# Patient Record
Sex: Male | Born: 1985 | Race: Black or African American | Hispanic: No | Marital: Single | Smoking: Current every day smoker
Health system: Southern US, Community
[De-identification: ages and names within clinical notes are randomized; demographics above are authoritative.]

## PROBLEM LIST (undated history)

## (undated) DIAGNOSIS — Z789 Other specified health status: Secondary | ICD-10-CM

## (undated) HISTORY — PX: ANTERIOR CRUCIATE LIGAMENT REPAIR: SHX115

## (undated) HISTORY — PX: NO PAST SURGERIES: SHX2092

---
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
## 2007-07-06 ENCOUNTER — Emergency Department (HOSPITAL_COMMUNITY): Admission: EM | Admit: 2007-07-06 | Discharge: 2007-07-06 | Payer: Self-pay | Admitting: Emergency Medicine

## 2011-11-18 ENCOUNTER — Ambulatory Visit (INDEPENDENT_AMBULATORY_CARE_PROVIDER_SITE_OTHER): Payer: 59 | Admitting: Family Medicine

## 2011-11-18 ENCOUNTER — Ambulatory Visit: Payer: 59

## 2011-11-18 VITALS — BP 140/81 | HR 88 | Temp 98.1°F | Resp 16 | Ht 69.78 in | Wt 228.2 lb

## 2011-11-18 DIAGNOSIS — M25569 Pain in unspecified knee: Secondary | ICD-10-CM

## 2011-11-18 DIAGNOSIS — M25562 Pain in left knee: Secondary | ICD-10-CM

## 2011-11-18 MED ORDER — MELOXICAM 15 MG PO TABS: 15.0000 mg | ORAL_TABLET | Freq: Every day | ORAL | Status: AC

## 2011-11-18 MED ORDER — HYDROCODONE-ACETAMINOPHEN 5-500 MG PO TABS: 1.0000 | ORAL_TABLET | Freq: Three times a day (TID) | ORAL | Status: AC | PRN

## 2011-11-18 NOTE — Progress Notes (Signed)
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  He could not continue to play because of pain.  This happened about 4 hours prior to exam  O:  Mild effusion, with swelling behind inferior patellar tendon Pain with ACL stress, no obvious laxity Tender MCL and LCL  [facility] reading (PRIMARY) by  Dr. Milus Glazier:  .no acute findings  A:  Internal derangement of knee  P:  Meloxicam, vicodin at hs Knee immobilizer Recheck 72 hours.

## 2011-11-18 NOTE — Patient Instructions (Addendum)
Recheck on Saturday    Knee Effusion The medical term for having fluid in your knee is effusion. This is often due to an internal derangement of the knee. This means something is wrong inside the knee. Some of the causes of fluid in the knee may be torn cartilage, a torn ligament, or bleeding into the joint from an injury. Your knee is likely more difficult to bend and move. This is often because there is increased pain and pressure in the joint. The time it takes for recovery from a knee effusion depends on different factors, including:   Type of injury.   Your age.   Physical and medical conditions.   Rehabilitation Strategies.  How long you will be away from your normal activities will depend on what kind of knee problem you have and how much damage is present. Your knee has two types of cartilage. Articular cartilage covers the bone ends and lets your knee bend and move smoothly. Two menisci, thick pads of cartilage that form a rim inside the joint, help absorb shock and stabilize your knee. Ligaments bind the bones together and support your knee joint. Muscles move the joint, help support your knee, and take stress off the joint itself. CAUSES  Often an effusion in the knee is caused by an injury to one of the menisci. This is often a tear in the cartilage. Recovery after a meniscus injury depends on how much meniscus is damaged and whether you have damaged other knee tissue. Small tears may heal on their own with conservative treatment. Conservative means rest, limited weight bearing activity and muscle strengthening exercises. Your recovery may take up to 6 weeks.  TREATMENT  Larger tears may require surgery. Meniscus injuries may be treated during arthroscopy. Arthroscopy is a procedure in which your surgeon uses a small telescope like instrument to look in your knee. Your caregiver can make a more accurate diagnosis (learning what is wrong) by performing an arthroscopic procedure. If your  injury is on the inner margin of the meniscus, your surgeon may trim the meniscus back to a smooth rim. In other cases your surgeon will try to repair a damaged meniscus with stitches (sutures). This may make rehabilitation take longer, but may provide better long term result by helping your knee keep its shock absorption capabilities. Ligaments which are completely torn usually require surgery for repair. HOME CARE INSTRUCTIONS  Use crutches as instructed.   If a brace is applied, use as directed.   Once you are home, an ice pack applied to your swollen knee may help with discomfort and help decrease swelling.   Keep your knee raised (elevated) when you are not up and around or on crutches.   Only take over-the-counter or prescription medicines for pain, discomfort, or fever as directed by your caregiver.   Your caregivers will help with instructions for rehabilitation of your knee. This often includes strengthening exercises.   You may resume a normal diet and activities as directed.  SEEK MEDICAL CARE IF:   There is increased swelling in your knee.   You notice redness, swelling, or increasing pain in your knee.   An unexplained oral temperature above 102 F (38.9 C) develops.  SEEK IMMEDIATE MEDICAL CARE IF:   You develop a rash.   You have difficulty breathing.   You have any allergic reactions from medications you may have been given.   There is severe pain with any motion of the knee.  MAKE SURE YOU:  Understand these instructions.   Will watch your condition.   Will get help right away if you are not doing well or get worse.  Document Released: 10/17/2003 Document Revised: 07/16/2011 Document Reviewed: 12/21/2007 [facility] Patient Information 2012 [place], [place].

## 2011-11-21 ENCOUNTER — Ambulatory Visit (INDEPENDENT_AMBULATORY_CARE_PROVIDER_SITE_OTHER): Payer: 59 | Admitting: Family Medicine

## 2011-11-21 VITALS — BP 141/83 | HR 71 | Temp 98.0°F | Resp 16 | Ht 69.0 in | Wt 234.0 lb

## 2011-11-21 DIAGNOSIS — M25562 Pain in left knee: Secondary | ICD-10-CM

## 2011-11-21 DIAGNOSIS — M25569 Pain in unspecified knee: Secondary | ICD-10-CM

## 2011-11-21 NOTE — Progress Notes (Signed)
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 He could not continue to play because of pain. This happened about 48 hours ago. Since that time, patient has noted decreasing pain and to some extent decreasing swelling. He continues to feel like his knee might give way however.  Objective moderate effusion, I felt a foreign body type nodule floating around the lateral aspect of the left knee and the joint space. Ligaments appear to be intact testing. He is nontender. There is no evidence of infection. He's able to flex his knee about 70.  Assessment: Internal derangement left knee. Mild improvement but I am very concerned he may have a torn cartilage or worse  Plan: MRI ASAP continue the knee immobilizer

## 2011-11-22 ENCOUNTER — Ambulatory Visit (HOSPITAL_COMMUNITY)
Admission: RE | Admit: 2011-11-22 | Discharge: 2011-11-22 | Disposition: A | Discharge status: Home / Self Care | Payer: 59 | Source: Ambulatory Visit | Attending: Family Medicine | Admitting: Family Medicine

## 2011-11-22 DIAGNOSIS — M25562 Pain in left knee: Secondary | ICD-10-CM

## 2011-11-22 DIAGNOSIS — IMO0002 Reserved for concepts with insufficient information to code with codable children: Secondary | ICD-10-CM | POA: Insufficient documentation

## 2011-11-22 DIAGNOSIS — Y9367 Activity, basketball: Secondary | ICD-10-CM | POA: Insufficient documentation

## 2011-11-22 DIAGNOSIS — S83509A Sprain of unspecified cruciate ligament of unspecified knee, initial encounter: Secondary | ICD-10-CM | POA: Insufficient documentation

## 2011-11-22 DIAGNOSIS — X500XXA Overexertion from strenuous movement or load, initial encounter: Secondary | ICD-10-CM | POA: Insufficient documentation

## 2011-11-23 ENCOUNTER — Telehealth: Payer: Self-pay | Admitting: Family Medicine

## 2011-11-23 ENCOUNTER — Other Ambulatory Visit: Payer: Self-pay | Admitting: Family Medicine

## 2011-11-23 DIAGNOSIS — S83519A Sprain of anterior cruciate ligament of unspecified knee, initial encounter: Secondary | ICD-10-CM

## 2011-11-23 NOTE — Telephone Encounter (Signed)
Referral made 

## 2011-11-27 ENCOUNTER — Encounter (HOSPITAL_BASED_OUTPATIENT_CLINIC_OR_DEPARTMENT_OTHER): Payer: Self-pay | Admitting: *Deleted

## 2011-11-27 ENCOUNTER — Other Ambulatory Visit: Payer: Self-pay | Admitting: Pain Medicine

## 2011-11-27 NOTE — Progress Notes (Signed)
Pt instructed npo p mn 2/22.  To [facility] 4/23 @ 1130.  Needs hgb on arrival.

## 2011-12-01 ENCOUNTER — Ambulatory Visit (HOSPITAL_BASED_OUTPATIENT_CLINIC_OR_DEPARTMENT_OTHER): Payer: 59

## 2011-12-01 ENCOUNTER — Ambulatory Visit (HOSPITAL_BASED_OUTPATIENT_CLINIC_OR_DEPARTMENT_OTHER)
Admission: RE | Admit: 2011-12-01 | Discharge: 2011-12-01 | Disposition: A | Discharge status: Home / Self Care | Payer: 59 | Source: Ambulatory Visit | Attending: Specialist | Admitting: Specialist

## 2011-12-01 ENCOUNTER — Encounter (HOSPITAL_BASED_OUTPATIENT_CLINIC_OR_DEPARTMENT_OTHER): Payer: Self-pay | Admitting: *Deleted

## 2011-12-01 ENCOUNTER — Encounter (HOSPITAL_BASED_OUTPATIENT_CLINIC_OR_DEPARTMENT_OTHER): Payer: Self-pay

## 2011-12-01 ENCOUNTER — Encounter (HOSPITAL_BASED_OUTPATIENT_CLINIC_OR_DEPARTMENT_OTHER)
Admission: RE | Disposition: A | Discharge status: Home / Self Care | Payer: Self-pay | Source: Ambulatory Visit | Attending: Specialist

## 2011-12-01 ENCOUNTER — Encounter (HOSPITAL_BASED_OUTPATIENT_CLINIC_OR_DEPARTMENT_OTHER): Payer: Self-pay | Admitting: Anesthesiology

## 2011-12-01 DIAGNOSIS — S83509A Sprain of unspecified cruciate ligament of unspecified knee, initial encounter: Secondary | ICD-10-CM | POA: Insufficient documentation

## 2011-12-01 DIAGNOSIS — Z9889 Other specified postprocedural states: Secondary | ICD-10-CM

## 2011-12-01 DIAGNOSIS — IMO0002 Reserved for concepts with insufficient information to code with codable children: Secondary | ICD-10-CM | POA: Insufficient documentation

## 2011-12-01 DIAGNOSIS — X58XXXA Exposure to other specified factors, initial encounter: Secondary | ICD-10-CM | POA: Insufficient documentation

## 2011-12-01 DIAGNOSIS — F17200 Nicotine dependence, unspecified, uncomplicated: Secondary | ICD-10-CM | POA: Insufficient documentation

## 2011-12-01 HISTORY — DX: Other specified health status: Z78.9

## 2011-12-01 LAB — POCT HEMOGLOBIN-HEMACUE: Hemoglobin: 17.5 g/dL — ABNORMAL HIGH (ref 13.0–17.0)

## 2011-12-01 SURGERY — KNEE ARTHROSCOPY WITH ANTERIOR CRUCIATE LIGAMENT (ACL) REPAIR
Anesthesia: General | Site: Knee | Laterality: Left | Wound class: Clean

## 2011-12-01 MED ORDER — MIDAZOLAM HCL 2 MG/2ML IJ SOLN
2.0000 mg | INTRAMUSCULAR | Status: DC | PRN
  Administered 2011-12-01 (×2): 2 mg via INTRAVENOUS
  Administered 2011-12-01: 1 mg via INTRAVENOUS

## 2011-12-01 MED ORDER — CEPHALEXIN 500 MG PO CAPS: 500.0000 mg | ORAL_CAPSULE | Freq: Three times a day (TID) | ORAL | Status: AC

## 2011-12-01 MED ORDER — LACTATED RINGERS IV SOLN: INTRAVENOUS | Status: DC

## 2011-12-01 MED ORDER — PROPOFOL 10 MG/ML IV EMUL
INTRAVENOUS | Status: DC | PRN
  Administered 2011-12-01: 300 mg via INTRAVENOUS

## 2011-12-01 MED ORDER — SODIUM CHLORIDE 0.9 % IJ SOLN
Freq: Once | INTRAMUSCULAR | Status: DC
  Filled 2011-12-01: qty 15

## 2011-12-01 MED ORDER — FENTANYL CITRATE 0.05 MG/ML IJ SOLN: 25.0000 ug | INTRAMUSCULAR | Status: DC | PRN

## 2011-12-01 MED ORDER — FENTANYL CITRATE 0.05 MG/ML IJ SOLN
INTRAMUSCULAR | Status: DC | PRN
  Administered 2011-12-01: 50 ug via INTRAVENOUS
  Administered 2011-12-01: 100 ug via INTRAVENOUS
  Administered 2011-12-01: 50 ug via INTRAVENOUS

## 2011-12-01 MED ORDER — DEXAMETHASONE SODIUM PHOSPHATE 4 MG/ML IJ SOLN
INTRAMUSCULAR | Status: DC | PRN
  Administered 2011-12-01: 10 mg via INTRAVENOUS

## 2011-12-01 MED ORDER — BUPIVACAINE HCL (PF) 0.25 % IJ SOLN: INTRAMUSCULAR | Status: DC | PRN

## 2011-12-01 MED ORDER — ONDANSETRON HCL 4 MG/2ML IJ SOLN
INTRAMUSCULAR | Status: DC | PRN
  Administered 2011-12-01: 4 mg via INTRAVENOUS

## 2011-12-01 MED ORDER — OXYCODONE-ACETAMINOPHEN 5-325 MG PO TABS: 2.0000 | ORAL_TABLET | ORAL | Status: DC | PRN

## 2011-12-01 MED ORDER — METHOCARBAMOL 500 MG PO TABS: 500.0000 mg | ORAL_TABLET | Freq: Four times a day (QID) | ORAL | Status: AC | PRN

## 2011-12-01 MED ORDER — POVIDONE-IODINE 7.5 % EX SOLN: Freq: Once | CUTANEOUS | Status: DC

## 2011-12-01 MED ORDER — LIDOCAINE HCL (CARDIAC) 20 MG/ML IV SOLN
INTRAVENOUS | Status: DC | PRN
  Administered 2011-12-01: 80 mg via INTRAVENOUS

## 2011-12-01 MED ORDER — LACTATED RINGERS IV SOLN
INTRAVENOUS | Status: DC
  Administered 2011-12-01 (×2): via INTRAVENOUS

## 2011-12-01 MED ORDER — MORPHINE SULFATE 4 MG/ML IJ SOLN
INTRAMUSCULAR | Status: DC | PRN
  Administered 2011-12-01: 16:00:00 via INTRA_ARTICULAR

## 2011-12-01 MED ORDER — OXYCODONE-ACETAMINOPHEN 5-325 MG PO TABS: 1.0000 | ORAL_TABLET | ORAL | Status: AC | PRN

## 2011-12-01 MED ORDER — OXYCODONE-ACETAMINOPHEN 5-325 MG PO TABS
1.0000 | ORAL_TABLET | ORAL | Status: AC | PRN
  Administered 2011-12-01: 1 via ORAL

## 2011-12-01 MED ORDER — SODIUM CHLORIDE 0.9 % IV SOLN: INTRAVENOUS | Status: DC

## 2011-12-01 MED ORDER — CEFAZOLIN SODIUM 1-5 GM-% IV SOLN
1.0000 g | INTRAVENOUS | Status: AC
  Administered 2011-12-01: 2 g via INTRAVENOUS

## 2011-12-01 MED ORDER — ROPIVACAINE HCL 5 MG/ML IJ SOLN
INTRAMUSCULAR | Status: DC | PRN
  Administered 2011-12-01: 30 mL

## 2011-12-01 MED ORDER — METHOCARBAMOL 500 MG PO TABS
500.0000 mg | ORAL_TABLET | Freq: Three times a day (TID) | ORAL | Status: AC
  Administered 2011-12-01: 500 mg via ORAL

## 2011-12-01 MED ORDER — FENTANYL CITRATE 0.05 MG/ML IJ SOLN
50.0000 ug | INTRAMUSCULAR | Status: DC | PRN
  Administered 2011-12-01 (×2): 50 ug via INTRAVENOUS

## 2011-12-01 MED ORDER — SODIUM CHLORIDE 0.9 % IR SOLN
Status: DC | PRN
  Administered 2011-12-01: 6

## 2011-12-01 SURGICAL SUPPLY — 73 items
ANCHOR BUTTON TIGHTROPE ACL RT (Orthopedic Implant) ×2 IMPLANT
ANCHOR PUSHLOCK BIOCOMP 3.5X19 (Orthopedic Implant) ×2 IMPLANT
BANDAGE ESMARK 6X9 LF (GAUZE/BANDAGES/DRESSINGS) ×1 IMPLANT
BANDAGE GAUZE ELAST BULKY 4 IN (GAUZE/BANDAGES/DRESSINGS) ×2 IMPLANT
BLADE 4.2CUDA (BLADE) IMPLANT
BLADE CUDA 5.5 (BLADE) ×2 IMPLANT
BLADE CUDA GRT WHITE 3.5 (BLADE) ×2 IMPLANT
BLADE SURG 10 STRL SS (BLADE) ×2 IMPLANT
BLADE SURG 15 STRL LF DISP TIS (BLADE) ×1 IMPLANT
BLADE SURG 15 STRL SS (BLADE) ×1
BNDG ESMARK 6X9 LF (GAUZE/BANDAGES/DRESSINGS) ×2
BUR OVAL 6.0 (BURR) IMPLANT
BUR VERTEX HOODED 4.5 (BURR) ×2 IMPLANT
CANISTER SUCT LVC 12 LTR MEDI- (MISCELLANEOUS) ×4 IMPLANT
CANISTER SUCTION 1200CC (MISCELLANEOUS) IMPLANT
CANISTER SUCTION 2500CC (MISCELLANEOUS) IMPLANT
CLOTH BEACON ORANGE TIMEOUT ST (SAFETY) ×2 IMPLANT
COVER TABLE BACK 60X90 (DRAPES) ×2 IMPLANT
DRAPE ARTHROSCOPY W/POUCH 114 (DRAPES) ×2 IMPLANT
DRAPE INCISE IOBAN 66X45 STRL (DRAPES) ×2 IMPLANT
DRAPE LG THREE QUARTER DISP (DRAPES) ×4 IMPLANT
DRAPE OEC MINIVIEW 54X84 (DRAPES) ×2 IMPLANT
DRILL PIN TRIM IT AR-4515 ×2 IMPLANT
DRSG PAD ABDOMINAL 8X10 ST (GAUZE/BANDAGES/DRESSINGS) ×4 IMPLANT
DURAPREP 26ML APPLICATOR (WOUND CARE) ×2 IMPLANT
ELECT REM PT RETURN 9FT ADLT (ELECTROSURGICAL) ×2
ELECTRODE REM PT RTRN 9FT ADLT (ELECTROSURGICAL) ×1 IMPLANT
FIBERSTICK 2 (SUTURE) ×2 IMPLANT
FLIP CUTTER ×2 IMPLANT
GAUZE XEROFORM 1X8 LF (GAUZE/BANDAGES/DRESSINGS) ×2 IMPLANT
GLOVE BIO SURGEON STRL SZ 6.5 (GLOVE) ×2 IMPLANT
GLOVE BIO SURGEON STRL SZ8 (GLOVE) ×6 IMPLANT
GLOVE ECLIPSE 6.0 STRL STRAW (GLOVE) ×2 IMPLANT
GLOVE INDICATOR 8.0 STRL GRN (GLOVE) ×4 IMPLANT
GLOVE SURG ORTHO 8.0 STRL STRW (GLOVE) ×2 IMPLANT
GOWN PREVENTION PLUS LG XLONG (DISPOSABLE) ×2 IMPLANT
GOWN STRL REIN XL XLG (GOWN DISPOSABLE) ×4 IMPLANT
IMMOBILIZER KNEE 24 THIGH 36 (MISCELLANEOUS) ×1 IMPLANT
IMMOBILIZER KNEE 24 UNIV (MISCELLANEOUS) ×2
IV NS IRRIG 3000ML ARTHROMATIC (IV SOLUTION) ×12 IMPLANT
KIT TRANSTIBIAL (DISPOSABLE) ×2 IMPLANT
KNEE WRAP E Z 3 GEL PACK (MISCELLANEOUS) ×2 IMPLANT
KNOT PUSHER/SUTURE CUTTER ×2 IMPLANT
MINI VAC (SURGICAL WAND) IMPLANT
Meniscal Cinch - Arthrex ×2 IMPLANT
NEEDLE HYPO 22GX1.5 SAFETY (NEEDLE) IMPLANT
PACK ARTHROSCOPY DSU (CUSTOM PROCEDURE TRAY) ×2 IMPLANT
PACK BASIN DAY SURGERY FS (CUSTOM PROCEDURE TRAY) ×2 IMPLANT
PADDING CAST ABS 4INX4YD NS (CAST SUPPLIES) ×1
PADDING CAST ABS COTTON 4X4 ST (CAST SUPPLIES) ×1 IMPLANT
PENCIL BUTTON HOLSTER BLD 10FT (ELECTRODE) ×2 IMPLANT
SCREW BIOCOMPOSITE 10X35 (Screw) ×2 IMPLANT
SET ARTHROSCOPY TUBING (MISCELLANEOUS) ×1
SET ARTHROSCOPY TUBING LN (MISCELLANEOUS) ×1 IMPLANT
SET PAD KNEE POSITIONER (MISCELLANEOUS) ×2 IMPLANT
SPONGE GAUZE 4X4 12PLY (GAUZE/BANDAGES/DRESSINGS) ×2 IMPLANT
SPONGE LAP 4X18 X RAY DECT (DISPOSABLE) ×2 IMPLANT
STRIP CLOSURE SKIN 1/2X4 (GAUZE/BANDAGES/DRESSINGS) ×2 IMPLANT
SUCTION FRAZIER TIP 10 FR DISP (SUCTIONS) ×2 IMPLANT
SUT 2 FIBERLOOP 20 STRT BLUE (SUTURE) ×4
SUT ETHILON 4 0 PS 2 18 (SUTURE) ×2 IMPLANT
SUT MNCRL AB 3-0 PS2 18 (SUTURE) ×2 IMPLANT
SUT VIC AB 0 CT1 36 (SUTURE) ×4 IMPLANT
SUT VIC AB 0 CT2 27 (SUTURE) ×10 IMPLANT
SUT VIC AB 2-0 CT1 27 (SUTURE) ×1
SUT VIC AB 2-0 CT1 TAPERPNT 27 (SUTURE) ×1 IMPLANT
SUTURE 2 FIBERLOOP 20 STRT BLU (SUTURE) ×2 IMPLANT
SYR CONTROL 10ML LL (SYRINGE) ×2 IMPLANT
TOWEL OR 17X24 6PK STRL BLUE (TOWEL DISPOSABLE) ×2 IMPLANT
TRANSTIBIAL RETRO DRILL PIN ×2 IMPLANT
WAND 30 DEG SABER W/CORD (SURGICAL WAND) IMPLANT
WAND 90 DEG TURBOVAC W/CORD (SURGICAL WAND) IMPLANT
WATER STERILE IRR 500ML POUR (IV SOLUTION) ×2 IMPLANT

## 2011-12-01 NOTE — Discharge Instructions (Signed)

## 2011-12-01 NOTE — Op Note (Signed)
Preop diagnosis left knee torn anterior cruciate ligament and possible torn medial meniscus Postoperative diagnoses #1 left knee complete rupture anterior cruciate ligament. #2 medial meniscus tear Procedure #1 left knee arthroscopic assisted hamstring anterior cruciate ligament reconstruction #2 medial meniscus repair Surgeon Valma Cava M.D. Asst. Kingsley Spittle Anesthesia Gen. with femoral nerve and knee block Estimated blood loss minimal Tourniquet time one hour and 40 minutes at 250 mm of mercury Drains none Complications none Disposition PACU stable  Operative details Patient and family was counseled in the holding area cracks side was marked. IV started sedation given and nerve blocks administered per the anesthesiologist. Chart signed. Taken operating room. IV Ancef Dennis Hayden to the operating room. TED hoses applied to the uninvolved leg.  In the operating room posterior general anesthesia options and were well padded. Left knee examined full range of motion markedly positive Lachman anterior drawer and pivot shift collateral ligaments and posterior lateral corner and PCL were stable. Elevated prepped with DuraPrep and draped into a sterile fashion exsanguinated with an Esmarch and tourniquet inflated to 250 mm mercury. Attention was first directed to harvesting the hamstring tendons through an incision made over the pes sartorius fascia was identified and opened gracilis and semitendinosus were identified and harvested in standard fashion protecting neurovascular structures. These were taken to the back table and prepared Mr. Kingsley Spittle. Sartorius fascia closed with Vicryl suture. Arthroscopic portion undertaken proximal medial inferomedial inferolateral portals were established. Diagnostic arthroscopy revealed a complete rupture of anterior cruciate ligament Dennis L. integrative maria shaver notchplasty was performed in standard technique confirming the over-the-top position. Medial  side inspected articular cartilage healthy meniscus red zone tear posteriorly unstable. Reduced anatomically and securely fixed with Arthrex meniscal sandwich device. Telephone joint unremarkable lateral side noted in: For normal lateral meniscus Knee was flexed the Arthrex guide was placed to the knee put into the over the top position small stab wound laterally and Arthrex retro-socket was performed FiberWire suture was passed tunnel was debrided and rasped . Tibial reamer was set into the anterior cruciate ligament footprint on the tibia and a retro-tunnel was performed tunnel was dilated properly suture was pulled distally and debris was removed from the knee. The graft was now delivered to the knee joint in a retrograde fashion the Arthrex anterior cruciate ligament tight grip system was utilized and the button was hooked up to the over-the-top position on the femoral cortex confirmed visually arthroscopically and radiographically with C-arm. Graft was delivered into the knee joint. Knee put through range of motion no notch impingement. The graft and then wrapped to create during the preparation. At 30 of proximal and neutral rotation on distally on the tibial graft it was securely fixed the tibial tunnel with an Arthrex bio composite screw. Graft had excellent orientation tension in the supplement distally with a push lock anchor. At that the knee the range of motion graft that had excellent orientation and tension the graft was stable throughout and radiographically the button was in excellent position the lateral femoral cortex. Was irrigated copiously. A scope equipment was removed. The lateral side the subcutaneous closed with Vicryl skin was nylon portal nylon anteriorly subcutaneous followed by subcuticular Monocryl suture. Another total of 20 cc of 0.25% Marcaine was placed into the skin edges and the joint with 4 mg of morphine sulfate into the knee joint. Sterile dressing was applied TED hose  tourniquet deflated ice pack and brace in full extension. At application of dressings tourniquet deflated normal pulses in the  foot and ankle. Awakened from general anesthesia and taken to the operating room to the PACU in stable condition.  To help with patient is a sling prepping draping technical and surgical assistance throughout the entire case graft preparation at the wound closure application of dressing and splint Mr. Kingsley Spittle PA-C assistance was needed.

## 2011-12-01 NOTE — H&P (Signed)
Dennis Hayden is an 26 y.o. male.   Chief Complaint: Left Knee pain and instablity HPI:Pt 37 YOA male with H/O injury to left knee. Eval with MRI found ACL tear. With pain and unstable feeling will have ACL reconstruction with autograft hamstring.  Past Medical History  Diagnosis Date  . No pertinent past medical history     Past Surgical History  Procedure Date  . No past surgeries     History reviewed. No pertinent family history. Social History:  reports that he has been smoking Cigars.  He does not have any smokeless tobacco history on file. He reports that he drinks alcohol. His drug history not on file.  Allergies: No Known Allergies  Medications Prior to Admission  Medication Sig Dispense Refill  . HYDROcodone-acetaminophen (VICODIN) 5-500 MG per tablet Take 1 tablet by mouth every 8 (eight) hours as needed.      . meloxicam (MOBIC) 15 MG tablet Take 1 tablet (15 mg total) by mouth daily.  30 tablet  0    Results for orders placed during the hospital encounter of 12/01/11 (from the past 48 hour(s))  POCT HEMOGLOBIN-HEMACUE     Status: Abnormal   Collection Time   12/01/11 12:21 PM      Component Value Range Comment   Hemoglobin 17.5 (*) 13.0 - 17.0 (g/dL)    No results found.  Review of Systems  Constitutional: Negative.   HENT: Negative.   Respiratory: Negative.   Cardiovascular: Negative.   Gastrointestinal: Negative.   Genitourinary: Negative.   Musculoskeletal: Positive for joint pain.  Skin: Negative.   Neurological: Negative.     Blood pressure 126/78, pulse 58, temperature 98.2 F (36.8 C), temperature source Oral, resp. rate 9, height 5\' 9"  (1.753 m), weight 104.327 kg (230 lb), SpO2 99.00%. Physical Exam Cons alert approp, no distress lungs clear, heart reg, abd soft +BS. Neck supple good rom. Extremities with out deformities, NMVI  Assessment/Plan Left Knee ACL tear  Plan Left Knee scope with eval possible Deb if needed, with ACL autograft hamstring  reconstruction. Jamelle Rushing 12/01/2011, 1:52 PM

## 2011-12-01 NOTE — Transfer of Care (Signed)
Immediate Anesthesia Transfer of Care Note  Patient: Dennis Hayden  Procedure(s) Performed: Procedure(s) (LRB): KNEE ARTHROSCOPY WITH ANTERIOR CRUCIATE LIGAMENT (ACL) REPAIR (Left)  Patient Location: PACU  Anesthesia Type: General  Level of Consciousness: awake, alert  and oriented  Airway & Oxygen Therapy: Patient Spontanous Breathing and Patient connected to nasal cannula oxygen  Post-op Assessment: Report given to PACU RN  Post vital signs: Reviewed and stable  Complications: No apparent anesthesia complications

## 2011-12-01 NOTE — Anesthesia Procedure Notes (Addendum)
Anesthesia Regional Block:  Femoral nerve block  Pre-Anesthetic Checklist: ,, timeout performed, Correct Patient, Correct Site, Correct Laterality, Correct Procedure, Correct Position, site marked, Risks and benefits discussed,  Surgical consent,  Pre-op evaluation,  At surgeon's request and post-op pain management  Laterality: Left  Prep: chloraprep       Needles:  Injection technique: Single-shot  Needle Type: Stimiplex          Additional Needles:  Procedures: ultrasound guided and nerve stimulator Femoral nerve block Narrative:  Start time: 12/01/2011 1:00 PM End time: 12/01/2011 1:13 PM Injection made incrementally with aspirations every 5 mL.  Performed by: Personally  Anesthesiologist: Gaetano Hawthorne MD  Additional Notes: Patient tolerated the procedure well without complications  Femoral nerve block Procedure Name: LMA Insertion Date/Time: 12/01/2011 2:21 PM Performed by: Maris Berger T Pre-anesthesia Checklist: Patient identified, Emergency Drugs available, Suction available and Patient being monitored Patient Re-evaluated:Patient Re-evaluated prior to inductionOxygen Delivery Method: Circle System Utilized Preoxygenation: Pre-oxygenation with 100% oxygen Intubation Type: IV induction Ventilation: Mask ventilation without difficulty LMA: LMA inserted LMA Size: 5.0 Number of attempts: 1 Airway Equipment and Method: bite block Placement Confirmation: positive ETCO2 Dental Injury: Teeth and Oropharynx as per pre-operative assessment
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
## 2011-12-01 NOTE — Anesthesia Preprocedure Evaluation (Signed)

## 2011-12-02 NOTE — Anesthesia Postprocedure Evaluation (Signed)
  Anesthesia Post-op Note  Patient: Dennis Hayden  Procedure(s) Performed: Procedure(s) (LRB): KNEE ARTHROSCOPY WITH ANTERIOR CRUCIATE LIGAMENT (ACL) REPAIR (Left)  Patient Location: PACU  Anesthesia Type: General  Level of Consciousness: awake and alert   Airway and Oxygen Therapy: Patient Spontanous Breathing  Post-op Pain: mild  Post-op Assessment: Post-op Vital signs reviewed, Patient's Cardiovascular Status Stable, Respiratory Function Stable, Patent Airway and No signs of Nausea or vomiting  Post-op Vital Signs: stable  Complications: No apparent anesthesia complications

## 2015-12-27 ENCOUNTER — Emergency Department (HOSPITAL_COMMUNITY)
Admission: EM | Admit: 2015-12-27 | Discharge: 2015-12-28 | Disposition: A | Discharge status: Home / Self Care | Payer: No Typology Code available for payment source | Attending: Emergency Medicine | Admitting: Emergency Medicine

## 2015-12-27 ENCOUNTER — Encounter (HOSPITAL_COMMUNITY): Payer: Self-pay | Admitting: Oncology

## 2015-12-27 DIAGNOSIS — S46911A Strain of unspecified muscle, fascia and tendon at shoulder and upper arm level, right arm, initial encounter: Secondary | ICD-10-CM | POA: Diagnosis not present

## 2015-12-27 DIAGNOSIS — Y92410 Unspecified street and highway as the place of occurrence of the external cause: Secondary | ICD-10-CM | POA: Diagnosis not present

## 2015-12-27 DIAGNOSIS — Y939 Activity, unspecified: Secondary | ICD-10-CM | POA: Diagnosis not present

## 2015-12-27 DIAGNOSIS — F17210 Nicotine dependence, cigarettes, uncomplicated: Secondary | ICD-10-CM | POA: Insufficient documentation

## 2015-12-27 DIAGNOSIS — R51 Headache: Secondary | ICD-10-CM

## 2015-12-27 DIAGNOSIS — R519 Headache, unspecified: Secondary | ICD-10-CM

## 2015-12-27 DIAGNOSIS — S0990XA Unspecified injury of head, initial encounter: Secondary | ICD-10-CM | POA: Diagnosis not present

## 2015-12-27 DIAGNOSIS — S161XXA Strain of muscle, fascia and tendon at neck level, initial encounter: Secondary | ICD-10-CM | POA: Insufficient documentation

## 2015-12-27 DIAGNOSIS — Y999 Unspecified external cause status: Secondary | ICD-10-CM | POA: Diagnosis not present

## 2015-12-27 DIAGNOSIS — T148XXA Other injury of unspecified body region, initial encounter: Secondary | ICD-10-CM
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
## 2015-12-28 MED ORDER — CYCLOBENZAPRINE HCL 10 MG PO TABS: 10.0000 mg | ORAL_TABLET | Freq: Two times a day (BID) | ORAL | Status: DC | PRN

## 2015-12-28 MED ORDER — HYDROCODONE-ACETAMINOPHEN 5-325 MG PO TABS
1.0000 | ORAL_TABLET | Freq: Once | ORAL | Status: AC
  Administered 2015-12-28: 1 via ORAL
  Filled 2015-12-28: qty 1

## 2015-12-28 MED ORDER — IBUPROFEN 800 MG PO TABS: 800.0000 mg | ORAL_TABLET | Freq: Three times a day (TID) | ORAL | Status: DC

## 2015-12-28 MED ORDER — HYDROCODONE-ACETAMINOPHEN 5-325 MG PO TABS: 1.0000 | ORAL_TABLET | ORAL | Status: DC | PRN
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
## 2015-12-28 NOTE — ED Notes (Signed)
PA at bedside.

## 2018-11-07 ENCOUNTER — Emergency Department (HOSPITAL_COMMUNITY): Payer: Self-pay

## 2018-11-07 ENCOUNTER — Emergency Department (HOSPITAL_COMMUNITY)
Admission: EM | Admit: 2018-11-07 | Discharge: 2018-11-07 | Disposition: A | Discharge status: Home / Self Care | Payer: Self-pay | Attending: Emergency Medicine | Admitting: Emergency Medicine

## 2018-11-07 ENCOUNTER — Encounter (HOSPITAL_COMMUNITY): Payer: Self-pay

## 2018-11-07 ENCOUNTER — Other Ambulatory Visit: Payer: Self-pay

## 2018-11-07 DIAGNOSIS — Y929 Unspecified place or not applicable: Secondary | ICD-10-CM | POA: Insufficient documentation

## 2018-11-07 DIAGNOSIS — F17210 Nicotine dependence, cigarettes, uncomplicated: Secondary | ICD-10-CM | POA: Insufficient documentation

## 2018-11-07 DIAGNOSIS — S82101A Unspecified fracture of upper end of right tibia, initial encounter for closed fracture: Secondary | ICD-10-CM | POA: Insufficient documentation

## 2018-11-07 DIAGNOSIS — S82141A Displaced bicondylar fracture of right tibia, initial encounter for closed fracture: Secondary | ICD-10-CM

## 2018-11-07 DIAGNOSIS — Y9389 Activity, other specified: Secondary | ICD-10-CM | POA: Insufficient documentation

## 2018-11-07 DIAGNOSIS — W03XXXA Other fall on same level due to collision with another person, initial encounter: Secondary | ICD-10-CM | POA: Insufficient documentation

## 2018-11-07 DIAGNOSIS — Y999 Unspecified external cause status: Secondary | ICD-10-CM | POA: Insufficient documentation

## 2018-11-07 DIAGNOSIS — S89201A Unspecified physeal fracture of upper end of right fibula, initial encounter for closed fracture: Secondary | ICD-10-CM | POA: Insufficient documentation

## 2018-11-07 MED ORDER — OXYCODONE-ACETAMINOPHEN 5-325 MG PO TABS
2.0000 | ORAL_TABLET | Freq: Once | ORAL | Status: AC
  Administered 2018-11-07: 2 via ORAL
  Filled 2018-11-07: qty 2

## 2018-11-07 MED ORDER — HYDROCODONE-ACETAMINOPHEN 5-325 MG PO TABS: 1.0000 | ORAL_TABLET | Freq: Four times a day (QID) | ORAL | 0 refills | Status: DC | PRN
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
## 2018-11-07 NOTE — ED Provider Notes (Signed)
  Physical Exam  BP (!) 168/98   Pulse 84   Temp 98.9 F (37.2 C) (Oral)   Resp 18   SpO2 100%   Physical Exam  ED Course/Procedures     Procedures  MDM  Received patient in signout.  CT scan done and showed tibial plateau fractures possible ACL injury and small fibular fracture.  Nonweightbearing with knee immobilizer.  Follow-up with Dr. Malvin Johns, MD 11/07/18 [phone]
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
## 2018-11-10 ENCOUNTER — Encounter (INDEPENDENT_AMBULATORY_CARE_PROVIDER_SITE_OTHER): Payer: Self-pay | Admitting: Orthopaedic Surgery

## 2018-11-10 ENCOUNTER — Other Ambulatory Visit: Payer: Self-pay

## 2018-11-10 ENCOUNTER — Ambulatory Visit (INDEPENDENT_AMBULATORY_CARE_PROVIDER_SITE_OTHER): Payer: Self-pay | Admitting: Orthopaedic Surgery

## 2018-11-10 DIAGNOSIS — M25561 Pain in right knee: Secondary | ICD-10-CM

## 2018-11-10 NOTE — Progress Notes (Signed)
Office Visit Note   Patient: Dennis Hayden           Date of Birth: Jul 22, 1986           MRN: [id number] Visit Date: 11/10/2018              Requested by: No referring provider defined for this encounter. PCP: Patient, No Pcp Per   Assessment & Plan: Visit Diagnoses:  1. Right knee pain, unspecified chronicity     Plan: Impression is right knee injury concerning for multi-ligamentous injury.  We will obtain MRI of the right knee urgently to rule out patella tendon rupture or high-grade partial rupture.  We will see him back to review the MRI within a week.  Follow-Up Instructions: Return in about 1 week (around 11/17/2018).   Orders:  Orders Placed This Encounter  Procedures  . MR Knee Right w/o contrast   No orders of the defined types were placed in this encounter.     Procedures: No procedures performed   Clinical Data: No additional findings.   Subjective: Chief Complaint  Patient presents with  . Right Knee - Pain, Injury    Dennis Hayden is a healthy 33 year old gentleman who sustained a right knee injury while running quickly up his stairs at his apartment.  He is not exactly sure what happened with his knee but his knee gave out and he fell.  He was evaluated in the ED recently and x-ray and CT showed multiple avulsion fractures and joint effusion concerning for ligamentous injury.  He has been ambulating with a knee immobilizer and crutches.  He has had a prior ACL reconstruction on his left knee.   Review of Systems  Constitutional: Negative.   All other systems reviewed and are negative.    Objective: Vital Signs: There were no vitals taken for this visit.  Physical Exam Vitals signs and nursing note reviewed.  Constitutional:      Appearance: He is well-developed.  HENT:     Head: Normocephalic and atraumatic.  Eyes:     Pupils: Pupils are equal, round, and reactive to light.  Neck:     Musculoskeletal: Neck supple.  Pulmonary:     Effort: Pulmonary  effort is normal.  Abdominal:     Palpations: Abdomen is soft.  Musculoskeletal: Normal range of motion.  Skin:    General: Skin is warm.  Neurological:     Mental Status: He is alert and oriented to person, place, and time.  Psychiatric:        Behavior: Behavior normal.        Thought Content: Thought content normal.        Judgment: Judgment normal.     Ortho Exam Right knee exam shows a moderate sized joint effusion.  There is laxity with Lockman test and anterior drawer test.  He does have a slight defect with palpation of the inferior pole of the patella which is tender to palpation.  He does have an extensor lag with straight leg raise.  Collaterals appear to be grossly intact. Specialty Comments:  No specialty comments available.  Imaging: No results found.   PMFS History: There are no active problems to display for this patient.  Past Medical History:  Diagnosis Date  . No pertinent past medical history     History reviewed. No pertinent family history.  Past Surgical History:  Procedure Laterality Date  . NO PAST SURGERIES     Social History   Occupational History  .  Not on file  Tobacco Use  . Smoking status: Current Every Day Smoker    Packs/day: 1.00    Types: Cigars  . Smokeless tobacco: Never Used  Substance and Sexual Activity  . Alcohol use: Yes  . Drug use: Yes    Types: Marijuana  . Sexual activity: Yes    Birth control/protection: None

## 2018-11-25 ENCOUNTER — Ambulatory Visit (INDEPENDENT_AMBULATORY_CARE_PROVIDER_SITE_OTHER): Payer: Self-pay | Admitting: Orthopedic Surgery

## 2018-11-30 ENCOUNTER — Encounter (INDEPENDENT_AMBULATORY_CARE_PROVIDER_SITE_OTHER): Payer: Self-pay | Admitting: *Deleted

## 2018-12-13 ENCOUNTER — Ambulatory Visit
Admission: RE | Admit: 2018-12-13 | Discharge: 2018-12-13 | Disposition: A | Discharge status: Home / Self Care | Payer: Self-pay | Source: Ambulatory Visit | Attending: Orthopaedic Surgery | Admitting: Orthopaedic Surgery

## 2018-12-13 ENCOUNTER — Other Ambulatory Visit: Payer: Self-pay

## 2018-12-13 DIAGNOSIS — M25561 Pain in right knee: Secondary | ICD-10-CM

## 2018-12-13 NOTE — Progress Notes (Signed)
Needs f/u appt asap

## 2018-12-20 ENCOUNTER — Ambulatory Visit (INDEPENDENT_AMBULATORY_CARE_PROVIDER_SITE_OTHER): Payer: Self-pay | Admitting: Orthopaedic Surgery

## 2018-12-20 ENCOUNTER — Encounter: Payer: Self-pay | Admitting: Orthopaedic Surgery

## 2018-12-20 ENCOUNTER — Other Ambulatory Visit: Payer: Self-pay

## 2018-12-20 DIAGNOSIS — S83511A Sprain of anterior cruciate ligament of right knee, initial encounter: Secondary | ICD-10-CM

## 2018-12-20 DIAGNOSIS — S83251A Bucket-handle tear of lateral meniscus, current injury, right knee, initial encounter: Secondary | ICD-10-CM

## 2018-12-20 NOTE — Progress Notes (Signed)
Office Visit Note   Patient: Dennis Hayden           Date of Birth: 08-05-1986           MRN: [id number] Visit Date: 12/20/2018              Requested by: No referring provider defined for this encounter. PCP: Patient, No Pcp Per   Assessment & Plan: Visit Diagnoses:  1. Complete tear of right ACL, initial encounter   2. Bucket handle tear of lateral meniscus of right knee, initial encounter     Plan: MRI findings are consistent with a complete ACL tear as well as a bucket-handle tear of the lateral meniscus that is displaced.  These findings were reviewed with the patient.  We discussed surgical versus nonsurgical treatment and the associated risks and benefits and given the displaced nature of the lateral meniscus and the size of the bucket-handle tear he understands and nonoperative treatment would likely result in a poor outcome.  Our plan is to perform a ACL reconstruction as well as a lateral meniscus repair if possible.  He is status post left ACL reconstruction about 8 to 9 years ago and did well from this.  Questions encouraged and answered.  We will schedule his surgery in the near future.  Follow-Up Instructions: Return for 1 week postop visit.   Orders:  No orders of the defined types were placed in this encounter.  No orders of the defined types were placed in this encounter.     Procedures: No procedures performed   Clinical Data: No additional findings.   Subjective: Chief Complaint  Patient presents with  . Right Knee - Pain    [name] follows up today for review of his right knee MRI.  He is currently wearing a hinged knee brace.  He is feeling much better and his range of motion has improved.  His swelling has also improved.  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   Review of Systems  Constitutional: Negative.   All other systems reviewed and are negative.    Objective: Vital Signs: There were no vitals taken for this visit.   Physical Exam Vitals signs and nursing note reviewed.  Constitutional:      Appearance: He is well-developed.  Pulmonary:     Effort: Pulmonary effort is normal.  Abdominal:     Palpations: Abdomen is soft.  Skin:    General: Skin is warm.  Neurological:     Mental Status: He is alert and oriented to person, place, and time.  Psychiatric:        Behavior: Behavior normal.        Thought Content: Thought content normal.        Judgment: Judgment normal.     Ortho Exam Right knee exam shows no joint effusion.  Positive Lockman test.  Positive McMurray on the lateral meniscus.  His range of motion has improved significantly although he still lacks a fair amount of flexion. Specialty Comments:  No specialty comments available.  Imaging: No results found.   PMFS History: There are no active problems to display for this patient.  Past Medical History:  Diagnosis Date  . No pertinent past medical history     History reviewed. No pertinent family history.  Past Surgical History:  Procedure Laterality Date  . NO PAST SURGERIES     Social History   Occupational History  . Not on file  Tobacco Use  .  Smoking status: Current Every Day Smoker    Packs/day: 1.00    Types: Cigars  . Smokeless tobacco: Never Used  Substance and Sexual Activity  . Alcohol use: Yes  . Drug use: Yes    Types: Marijuana  . Sexual activity: Yes    Birth control/protection: None

## 2019-01-21 ENCOUNTER — Other Ambulatory Visit (HOSPITAL_COMMUNITY): Payer: Self-pay

## 2019-01-27 ENCOUNTER — Other Ambulatory Visit: Payer: Self-pay

## 2019-01-27 ENCOUNTER — Encounter (HOSPITAL_BASED_OUTPATIENT_CLINIC_OR_DEPARTMENT_OTHER): Payer: Self-pay | Admitting: *Deleted

## 2019-01-30 ENCOUNTER — Other Ambulatory Visit (HOSPITAL_COMMUNITY)
Admission: RE | Admit: 2019-01-30 | Discharge: 2019-01-30 | Disposition: A | Discharge status: Home / Self Care | Payer: HRSA Program | Source: Ambulatory Visit | Attending: Orthopaedic Surgery | Admitting: Orthopaedic Surgery

## 2019-01-30 ENCOUNTER — Encounter (HOSPITAL_BASED_OUTPATIENT_CLINIC_OR_DEPARTMENT_OTHER)
Admission: RE | Admit: 2019-01-30 | Discharge: 2019-01-30 | Disposition: A | Discharge status: Home / Self Care | Payer: Self-pay | Source: Ambulatory Visit | Attending: Orthopaedic Surgery | Admitting: Orthopaedic Surgery

## 2019-01-30 DIAGNOSIS — Z01812 Encounter for preprocedural laboratory examination: Secondary | ICD-10-CM | POA: Insufficient documentation

## 2019-01-30 DIAGNOSIS — Z1159 Encounter for screening for other viral diseases: Secondary | ICD-10-CM | POA: Diagnosis present

## 2019-01-30 LAB — BASIC METABOLIC PANEL
Anion gap: 9 (ref 5–15)
BUN: 10 mg/dL (ref 6–20)
CO2: 24 mmol/L (ref 22–32)
Calcium: 9.6 mg/dL (ref 8.9–10.3)
Chloride: 103 mmol/L (ref 98–111)
Creatinine, Ser: 1.09 mg/dL (ref 0.61–1.24)
GFR calc Af Amer: >60 mL/min (ref >60)
GFR calc non Af Amer: >60 mL/min (ref >60)
Glucose, Bld: 102 mg/dL — ABNORMAL HIGH (ref 70–99)
Potassium: 4.2 mmol/L (ref 3.5–5.1)
Sodium: 136 mmol/L (ref 135–145)

## 2019-01-30 LAB — SARS CORONAVIRUS 2 (TAT 6-24 HRS): SARS Coronavirus 2: NEGATIVE

## 2019-01-30 NOTE — Progress Notes (Signed)
Ensure Pre-Surgery drink given to pt with instructions to complete by 0800 DOS.  Surgical scrub also given with instructions for use.  Pt verbalized understanding.

## 2019-02-01 ENCOUNTER — Inpatient Hospital Stay: Payer: Self-pay | Admitting: Orthopaedic Surgery

## 2019-02-02 ENCOUNTER — Encounter (HOSPITAL_BASED_OUTPATIENT_CLINIC_OR_DEPARTMENT_OTHER): Payer: Self-pay | Admitting: Anesthesiology

## 2019-02-02 ENCOUNTER — Ambulatory Visit (HOSPITAL_BASED_OUTPATIENT_CLINIC_OR_DEPARTMENT_OTHER): Payer: Self-pay | Admitting: Certified Registered"

## 2019-02-02 ENCOUNTER — Ambulatory Visit (HOSPITAL_BASED_OUTPATIENT_CLINIC_OR_DEPARTMENT_OTHER)
Admission: RE | Admit: 2019-02-02 | Discharge: 2019-02-02 | Disposition: A | Discharge status: Home / Self Care | Payer: Self-pay | Attending: Orthopaedic Surgery | Admitting: Orthopaedic Surgery

## 2019-02-02 ENCOUNTER — Other Ambulatory Visit: Payer: Self-pay

## 2019-02-02 ENCOUNTER — Encounter: Payer: Self-pay | Admitting: Orthopaedic Surgery

## 2019-02-02 ENCOUNTER — Encounter (HOSPITAL_BASED_OUTPATIENT_CLINIC_OR_DEPARTMENT_OTHER)
Admission: RE | Disposition: A | Discharge status: Home / Self Care | Payer: Self-pay | Source: Home / Self Care | Attending: Orthopaedic Surgery

## 2019-02-02 DIAGNOSIS — F17210 Nicotine dependence, cigarettes, uncomplicated: Secondary | ICD-10-CM | POA: Insufficient documentation

## 2019-02-02 DIAGNOSIS — E669 Obesity, unspecified: Secondary | ICD-10-CM | POA: Insufficient documentation

## 2019-02-02 DIAGNOSIS — F17290 Nicotine dependence, other tobacco product, uncomplicated: Secondary | ICD-10-CM | POA: Insufficient documentation

## 2019-02-02 DIAGNOSIS — Z6829 Body mass index (BMI) 29.0-29.9, adult: Secondary | ICD-10-CM | POA: Insufficient documentation

## 2019-02-02 DIAGNOSIS — X58XXXA Exposure to other specified factors, initial encounter: Secondary | ICD-10-CM | POA: Insufficient documentation

## 2019-02-02 DIAGNOSIS — S83511A Sprain of anterior cruciate ligament of right knee, initial encounter: Secondary | ICD-10-CM | POA: Insufficient documentation

## 2019-02-02 DIAGNOSIS — S83251A Bucket-handle tear of lateral meniscus, current injury, right knee, initial encounter: Secondary | ICD-10-CM | POA: Insufficient documentation

## 2019-02-02 HISTORY — PX: KNEE ARTHROSCOPY WITH ANTERIOR CRUCIATE LIGAMENT (ACL) REPAIR: SHX5644

## 2019-02-02 SURGERY — KNEE ARTHROSCOPY WITH ANTERIOR CRUCIATE LIGAMENT (ACL) REPAIR
Anesthesia: General | Site: Knee | Laterality: Right

## 2019-02-02 MED ORDER — ONDANSETRON HCL 4 MG/2ML IJ SOLN
INTRAMUSCULAR | Status: DC | PRN
  Administered 2019-02-02: 4 mg via INTRAVENOUS

## 2019-02-02 MED ORDER — METHOCARBAMOL 750 MG PO TABS: 750.0000 mg | ORAL_TABLET | Freq: Two times a day (BID) | ORAL | 0 refills | Status: DC | PRN

## 2019-02-02 MED ORDER — PROMETHAZINE HCL 25 MG PO TABS: 25.0000 mg | ORAL_TABLET | Freq: Four times a day (QID) | ORAL | 1 refills | Status: DC | PRN

## 2019-02-02 MED ORDER — BUPIVACAINE-EPINEPHRINE (PF) 0.5% -1:200000 IJ SOLN
INTRAMUSCULAR | Status: DC | PRN
  Administered 2019-02-02: 30 mL via PERINEURAL

## 2019-02-02 MED ORDER — FENTANYL CITRATE (PF) 100 MCG/2ML IJ SOLN
INTRAMUSCULAR | Status: AC
  Filled 2019-02-02: qty 2

## 2019-02-02 MED ORDER — LABETALOL HCL 5 MG/ML IV SOLN
5.0000 mg | Freq: Once | INTRAVENOUS | Status: AC
  Administered 2019-02-02: 5 mg via INTRAVENOUS

## 2019-02-02 MED ORDER — FENTANYL CITRATE (PF) 100 MCG/2ML IJ SOLN
INTRAMUSCULAR | Status: DC | PRN
  Administered 2019-02-02 (×4): 50 ug via INTRAVENOUS
  Administered 2019-02-02: 100 ug via INTRAVENOUS
  Administered 2019-02-02 (×3): 50 ug via INTRAVENOUS

## 2019-02-02 MED ORDER — CEFAZOLIN SODIUM-DEXTROSE 2-4 GM/100ML-% IV SOLN
2.0000 g | INTRAVENOUS | Status: AC
  Administered 2019-02-02: 2 g via INTRAVENOUS

## 2019-02-02 MED ORDER — BUPIVACAINE HCL (PF) 0.5 % IJ SOLN
INTRAMUSCULAR | Status: DC | PRN
  Administered 2019-02-02: 20 mL

## 2019-02-02 MED ORDER — FENTANYL CITRATE (PF) 100 MCG/2ML IJ SOLN
50.0000 ug | INTRAMUSCULAR | Status: DC | PRN
  Administered 2019-02-02: 100 ug via INTRAVENOUS

## 2019-02-02 MED ORDER — CHLORHEXIDINE GLUCONATE 4 % EX LIQD: 60.0000 mL | Freq: Once | CUTANEOUS | Status: DC

## 2019-02-02 MED ORDER — ONDANSETRON HCL 4 MG/2ML IJ SOLN
INTRAMUSCULAR | Status: AC
  Filled 2019-02-02: qty 2

## 2019-02-02 MED ORDER — HYDROCODONE-ACETAMINOPHEN 7.5-325 MG PO TABS: 1.0000 | ORAL_TABLET | Freq: Once | ORAL | Status: DC | PRN

## 2019-02-02 MED ORDER — HYDROMORPHONE HCL 1 MG/ML IJ SOLN: 0.2500 mg | INTRAMUSCULAR | Status: DC | PRN

## 2019-02-02 MED ORDER — ONDANSETRON HCL 4 MG/2ML IJ SOLN: 4.0000 mg | Freq: Once | INTRAMUSCULAR | Status: DC | PRN

## 2019-02-02 MED ORDER — DEXMEDETOMIDINE HCL IN NACL 200 MCG/50ML IV SOLN
INTRAVENOUS | Status: AC
  Filled 2019-02-02: qty 50

## 2019-02-02 MED ORDER — MEPERIDINE HCL 25 MG/ML IJ SOLN: 6.2500 mg | INTRAMUSCULAR | Status: DC | PRN

## 2019-02-02 MED ORDER — DEXAMETHASONE SODIUM PHOSPHATE 10 MG/ML IJ SOLN
INTRAMUSCULAR | Status: AC
  Filled 2019-02-02: qty 1

## 2019-02-02 MED ORDER — LABETALOL HCL 5 MG/ML IV SOLN
INTRAVENOUS | Status: AC
  Filled 2019-02-02: qty 4

## 2019-02-02 MED ORDER — LACTATED RINGERS IV SOLN
INTRAVENOUS | Status: DC
  Administered 2019-02-02: 11:00:00 via INTRAVENOUS

## 2019-02-02 MED ORDER — DEXAMETHASONE SODIUM PHOSPHATE 10 MG/ML IJ SOLN
INTRAMUSCULAR | Status: DC | PRN
  Administered 2019-02-02: 10 mg via INTRAVENOUS

## 2019-02-02 MED ORDER — PROPOFOL 10 MG/ML IV BOLUS
INTRAVENOUS | Status: DC | PRN
  Administered 2019-02-02: 250 mg via INTRAVENOUS

## 2019-02-02 MED ORDER — MORPHINE SULFATE 10 MG/ML IJ SOLN
INTRAMUSCULAR | Status: DC | PRN
  Administered 2019-02-02: 4 mg via INTRAVENOUS

## 2019-02-02 MED ORDER — BUPIVACAINE HCL (PF) 0.5 % IJ SOLN
INTRAMUSCULAR | Status: AC
  Filled 2019-02-02: qty 30

## 2019-02-02 MED ORDER — LIDOCAINE 2% (20 MG/ML) 5 ML SYRINGE
INTRAMUSCULAR | Status: DC | PRN
  Administered 2019-02-02: 80 mg via INTRAVENOUS

## 2019-02-02 MED ORDER — SCOPOLAMINE 1 MG/3DAYS TD PT72: 1.0000 | MEDICATED_PATCH | Freq: Once | TRANSDERMAL | Status: DC

## 2019-02-02 MED ORDER — MIDAZOLAM HCL 2 MG/2ML IJ SOLN
INTRAMUSCULAR | Status: AC
  Filled 2019-02-02: qty 2

## 2019-02-02 MED ORDER — MIDAZOLAM HCL 2 MG/2ML IJ SOLN
1.0000 mg | INTRAMUSCULAR | Status: DC | PRN
  Administered 2019-02-02: 2 mg via INTRAVENOUS

## 2019-02-02 MED ORDER — PROPOFOL 10 MG/ML IV BOLUS
INTRAVENOUS | Status: AC
  Filled 2019-02-02: qty 40

## 2019-02-02 MED ORDER — OXYCODONE-ACETAMINOPHEN 5-325 MG PO TABS: 1.0000 | ORAL_TABLET | Freq: Four times a day (QID) | ORAL | 0 refills | Status: DC | PRN

## 2019-02-02 MED ORDER — DEXMEDETOMIDINE HCL 200 MCG/2ML IV SOLN
INTRAVENOUS | Status: DC | PRN
  Administered 2019-02-02: 20 ug via INTRAVENOUS

## 2019-02-02 MED ORDER — LIDOCAINE 2% (20 MG/ML) 5 ML SYRINGE
INTRAMUSCULAR | Status: AC
  Filled 2019-02-02: qty 5

## 2019-02-02 MED ORDER — LACTATED RINGERS IV SOLN
INTRAVENOUS | Status: DC
  Administered 2019-02-02 (×2): via INTRAVENOUS

## 2019-02-02 MED ORDER — PHENYLEPHRINE 40 MCG/ML (10ML) SYRINGE FOR IV PUSH (FOR BLOOD PRESSURE SUPPORT)
PREFILLED_SYRINGE | INTRAVENOUS | Status: AC
  Filled 2019-02-02: qty 10

## 2019-02-02 MED ORDER — MORPHINE SULFATE (PF) 4 MG/ML IV SOLN
INTRAVENOUS | Status: AC
  Filled 2019-02-02: qty 1

## 2019-02-02 MED ORDER — LABETALOL HCL 5 MG/ML IV SOLN
INTRAVENOUS | Status: DC | PRN
  Administered 2019-02-02 (×2): 5 mg via INTRAVENOUS

## 2019-02-02 MED ORDER — CEFAZOLIN SODIUM-DEXTROSE 2-4 GM/100ML-% IV SOLN
INTRAVENOUS | Status: AC
  Filled 2019-02-02: qty 100

## 2019-02-02 SURGICAL SUPPLY — 96 items
ANCHOR BUTTON TIGHTROPE ACL RT (Orthopedic Implant) ×2 IMPLANT
BANDAGE ACE 6X5 VEL STRL LF (GAUZE/BANDAGES/DRESSINGS) ×3 IMPLANT
BANDAGE ESMARK 6X9 LF (GAUZE/BANDAGES/DRESSINGS) ×1 IMPLANT
BLADE CUDA GRT WHITE 3.5 (BLADE) ×3 IMPLANT
BLADE CUTTER GATOR 3.5 (BLADE) IMPLANT
BLADE GREAT WHITE 4.2 (BLADE) IMPLANT
BLADE GREAT WHITE 4.2MM (BLADE)
BLADE LANZA CVD 15 DEG (BLADE) IMPLANT
BLADE SURG 15 STRL LF DISP TIS (BLADE) ×1 IMPLANT
BLADE SURG 15 STRL SS (BLADE) ×2
BNDG ESMARK 6X9 LF (GAUZE/BANDAGES/DRESSINGS) ×3
BUR OVAL 4.0 (BURR) ×3 IMPLANT
BUR OVAL 6.0 (BURR) IMPLANT
CLOSURE WOUND 1/2 X4 (GAUZE/BANDAGES/DRESSINGS)
COVER BACK TABLE REUSABLE LG (DRAPES) ×3 IMPLANT
COVER WAND RF STERILE (DRAPES) IMPLANT
CUFF TOURN SGL QUICK 34 (TOURNIQUET CUFF) ×2
CUFF TRNQT CYL 34X4.125X (TOURNIQUET CUFF) IMPLANT
CUTTER MENISCUS  4.2MM (BLADE)
CUTTER MENISCUS 4.2MM (BLADE) IMPLANT
CUTTER TENSIONER SUT 2-0 0 FBW (INSTRUMENTS) IMPLANT
DECANTER SPIKE VIAL GLASS SM (MISCELLANEOUS) IMPLANT
DRAPE ARTHROSCOPY W/POUCH 90 (DRAPES) ×3 IMPLANT
DRAPE IMP U-DRAPE 54X76 (DRAPES) ×3 IMPLANT
DRAPE U-SHAPE 47X51 STRL (DRAPES) ×3 IMPLANT
DRILL FLIPCUTTER III 6-12 (ORTHOPEDIC DISPOSABLE SUPPLIES) ×1 IMPLANT
DRSG EMULSION OIL 3X3 NADH (GAUZE/BANDAGES/DRESSINGS) ×3 IMPLANT
DRSG PAD ABDOMINAL 8X10 ST (GAUZE/BANDAGES/DRESSINGS) ×3 IMPLANT
DURAPREP 26ML APPLICATOR (WOUND CARE) ×3 IMPLANT
ELECT REM PT RETURN 9FT ADLT (ELECTROSURGICAL) ×3
ELECTRODE REM PT RTRN 9FT ADLT (ELECTROSURGICAL) ×1 IMPLANT
FIBERSTICK 2 (SUTURE) ×2 IMPLANT
FLIPCUTTER III 6-12 AR-1204FF (ORTHOPEDIC DISPOSABLE SUPPLIES) ×3
GAUZE SPONGE 4X4 12PLY STRL (GAUZE/BANDAGES/DRESSINGS) ×3 IMPLANT
GLOVE BIO SURGEON STRL SZ 6.5 (GLOVE) ×1 IMPLANT
GLOVE BIO SURGEONS STRL SZ 6.5 (GLOVE) ×1
GLOVE BIOGEL PI IND STRL 7.0 (GLOVE) ×1 IMPLANT
GLOVE BIOGEL PI INDICATOR 7.0 (GLOVE) ×4
GLOVE ECLIPSE 7.0 STRL STRAW (GLOVE) ×3 IMPLANT
GLOVE SKINSENSE NS SZ7.5 (GLOVE) ×4
GLOVE SKINSENSE STRL SZ7.5 (GLOVE) ×2 IMPLANT
GLOVE SURG SYN 7.5  E (GLOVE) ×4
GLOVE SURG SYN 7.5 E (GLOVE) ×2 IMPLANT
GLOVE SURG SYN 7.5 PF PI (GLOVE) ×2 IMPLANT
GOWN STRL REIN XL XLG (GOWN DISPOSABLE) ×3 IMPLANT
GOWN STRL REUS W/ TWL LRG LVL3 (GOWN DISPOSABLE) ×2 IMPLANT
GOWN STRL REUS W/ TWL XL LVL3 (GOWN DISPOSABLE) ×1 IMPLANT
GOWN STRL REUS W/TWL LRG LVL3 (GOWN DISPOSABLE) ×4
GOWN STRL REUS W/TWL XL LVL3 (GOWN DISPOSABLE) ×2
GRAFT TISS ANT TIB TNDN (Tissue) IMPLANT
IMMOBILIZER KNEE 22 UNIV (SOFTGOODS) IMPLANT
IMMOBILIZER KNEE 24 THIGH 36 (MISCELLANEOUS) IMPLANT
IMMOBILIZER KNEE 24 UNIV (MISCELLANEOUS) ×3
IMPL MENISCAL REP CVD NDL (Anchor) IMPLANT
IMPL SEQUENT MENISCAL 4 (Miscellaneous) IMPLANT
IMPLANT MENISCAL REP CVD NDL (Anchor) ×3 IMPLANT
IMPLANT SEQUENT MENISCAL 4 (Miscellaneous) ×3 IMPLANT
KIT TRANSTIBIAL (DISPOSABLE) ×2 IMPLANT
KNEE WRAP E Z 3 GEL PACK (MISCELLANEOUS) ×3 IMPLANT
LOOP 2 FIBERLINK CLOSED (SUTURE) IMPLANT
MANIFOLD NEPTUNE II (INSTRUMENTS) ×3 IMPLANT
NS IRRIG 1000ML POUR BTL (IV SOLUTION) ×3 IMPLANT
PACK ARTHROSCOPY DSU (CUSTOM PROCEDURE TRAY) ×3 IMPLANT
PACK BASIN DAY SURGERY FS (CUSTOM PROCEDURE TRAY) ×3 IMPLANT
PAD CAST 4YDX4 CTTN HI CHSV (CAST SUPPLIES) ×1 IMPLANT
PADDING CAST COTTON 4X4 STRL (CAST SUPPLIES) ×2
PADDING CAST COTTON 6X4 STRL (CAST SUPPLIES) ×3 IMPLANT
PENCIL BUTTON HOLSTER BLD 10FT (ELECTRODE) IMPLANT
PROBE BIPOLAR ATHRO 135MM 90D (MISCELLANEOUS) ×3 IMPLANT
SCREW FT BIOCOMP 9X30 (Screw) ×2 IMPLANT
SHAVER 4.2 MM LANZA 9391A (BLADE) ×2 IMPLANT
SLEEVE SCD COMPRESS KNEE MED (MISCELLANEOUS) ×2 IMPLANT
SPONGE LAP 18X18 RF (DISPOSABLE) ×2 IMPLANT
STRIP CLOSURE SKIN 1/2X4 (GAUZE/BANDAGES/DRESSINGS) IMPLANT
SUCTION FRAZIER HANDLE 10FR (MISCELLANEOUS) ×2
SUCTION TUBE FRAZIER 10FR DISP (MISCELLANEOUS) IMPLANT
SUT ETHILON 3 0 FSL (SUTURE) ×3 IMPLANT
SUT ETHILON 3 0 PS 1 (SUTURE) ×5 IMPLANT
SUT FIBERWIRE #2 38 T-5 BLUE (SUTURE)
SUT MNCRL AB 4-0 PS2 18 (SUTURE) ×2 IMPLANT
SUT VIC AB 2-0 CT1 27 (SUTURE)
SUT VIC AB 2-0 CT1 TAPERPNT 27 (SUTURE) IMPLANT
SUT VIC AB 2-0 SH 27 (SUTURE) ×2
SUT VIC AB 2-0 SH 27XBRD (SUTURE) IMPLANT
SUT VIC AB 3-0 SH 27 (SUTURE)
SUT VIC AB 3-0 SH 27X BRD (SUTURE) IMPLANT
SUT VICRYL 4-0 PS2 18IN ABS (SUTURE) IMPLANT
SUTURE FIBERWR #2 38 T-5 BLUE (SUTURE) IMPLANT
SUTURE TAPE 1.3 FIBERLOP 20 ST (SUTURE) IMPLANT
SUTURE TIGERSTICK 2 TIGERWIR 2 (MISCELLANEOUS) IMPLANT
SUTURETAPE 1.3 FIBERLOOP 20 ST (SUTURE)
TENDON ANTERIOR TIBIALIS (Tissue) ×3 IMPLANT
TIGERSTICK 2 TIGERWIRE 2 (MISCELLANEOUS) ×3
TOWEL GREEN STERILE FF (TOWEL DISPOSABLE) ×6 IMPLANT
TUBING ARTHRO INFLOW-ONLY STRL (TUBING) ×3 IMPLANT
WATER STERILE IRR 1000ML POUR (IV SOLUTION) ×3 IMPLANT

## 2019-02-02 NOTE — Discharge Instructions (Signed)
° ° °Post-operative patient instructions  °Knee Arthroscopy  ° °• Ice:  Place intermittent ice or cooler pack over your knee, 30 minutes on and 30 minutes off.  Continue this for the first 72 hours after surgery, then save ice for use after therapy sessions or on more active days.   °• Weight:  You may bear weight on your leg as your symptoms allow. °• Crutches:  Use crutches (or walker) to assist in walking until told to discontinue by your physical therapist or physician. This will help to reduce pain. °• Strengthening:  Perform simple thigh squeezes (isometric quad contractions) and straight leg lifts as you are able (3 sets of 5 to 10 repetitions, 3 times a day).  For the leg lifts, have someone support under your ankle in the beginning until you have increased strength enough to do this on your own.  To help get started on thigh squeezes, place a pillow under your knee and push down on the pillow with back of knee (sometimes easier to do than with your leg fully straight). °• Motion:  Perform gentle knee motion as tolerated - this is gentle bending and straightening of the knee. Seated heel slides: you can start by sitting in a chair, remove your brace, and gently slide your heel back on the floor - allowing your knee to bend. Have someone help you straighten your knee (or use your other leg/foot hooked under your ankle.  °• Dressing:  Perform 1st dressing change at 2 days postoperative. A moderate amount of blood tinged drainage is to be expected.  So if you bleed through the dressing on the first or second day or if you have fevers, it is fine to change the dressing/check the wounds early and redress wound. Elevate your leg.  If it bleeds through again, or if the incisions are leaking frank blood, please call the office. May change dressing every 1-2 days thereafter to help watch wounds. Can purchase Tegaderm (or 3M Nexcare) water resistant dressings at local pharmacy / [facility]. °• Shower:  Light shower is  ok after 2 days.  Please take shower, NO bath. Recover with gauze and ace wrap to help keep wounds protected.   °• Pain medication:  A narcotic pain medication has been prescribed.  Take as directed.  Typically you need narcotic pain medication more regularly during the first 3 to 5 days after surgery.  Decrease your use of the medication as the pain improves.  Narcotics can sometimes cause constipation, even after a few doses.  If you have problems with constipation, you can take an over the counter stool softener or light laxative.  If you have persistent problems, please notify your physician’s office. °• Physical therapy: Additional activity guidelines to be provided by your physician or physical therapist at follow-up visits.  °• Driving: Do not recommend driving x 2 weeks post surgical, especially if surgery performed on right side. Should not drive while taking narcotic pain medications. It typically takes at least 2 weeks to restore sufficient neuromuscular function for normal reaction times for driving safety.  °• Call [phone] for questions or problems. Evenings you will be forwarded to the hospital operator.  Ask for the orthopaedic physician on call. Please call if you experience:  °  °o Redness, foul smelling, or persistent drainage from the surgical site  °o worsening knee pain and swelling not responsive to medication  °o any calf pain and or swelling of the lower leg  °o temperatures greater than   101.5 F °o other questions or concerns ° ° °Thank you for allowing us to be a part of your care. ° ° ° ° ° °Post Anesthesia Home Care Instructions ° °Activity: °Get plenty of rest for the remainder of the day. A responsible individual must stay with you for 24 hours following the procedure.  °For the next 24 hours, DO NOT: °-Drive a car °-Operate machinery °-Drink alcoholic beverages °-Take any medication unless instructed by your physician °-Make any legal decisions or sign important  papers. ° °Meals: °Start with liquid foods such as gelatin or soup. Progress to regular foods as tolerated. Avoid greasy, spicy, heavy foods. If nausea and/or vomiting occur, drink only clear liquids until the nausea and/or vomiting subsides. Call your physician if vomiting continues. ° °Special Instructions/Symptoms: °Your throat may feel dry or sore from the anesthesia or the breathing tube placed in your throat during surgery. If this causes discomfort, gargle with warm salt water. The discomfort should disappear within 24 hours. ° °If you had a scopolamine patch placed behind your ear for the management of post- operative nausea and/or vomiting: ° °1. The medication in the patch is effective for 72 hours, after which it should be removed.  Wrap patch in a tissue and discard in the trash. Wash hands thoroughly with soap and water. °2. You may remove the patch earlier than 72 hours if you experience unpleasant side effects which may include dry mouth, dizziness or visual disturbances. °3. Avoid touching the patch. Wash your hands with soap and water after contact with the patch. °   ° ° ° °Regional Anesthesia Blocks ° °1. Numbness or the inability to move the "blocked" extremity may last from 3-48 hours after placement. The length of time depends on the medication injected and your individual response to the medication. If the numbness is not going away after 48 hours, call your surgeon. ° °2. The extremity that is blocked will need to be protected until the numbness is gone and the  Strength has returned. Because you cannot feel it, you will need to take extra care to avoid injury. Because it may be weak, you may have difficulty moving it or using it. You may not know what position it is in without looking at it while the block is in effect. ° °3. For blocks in the legs and feet, returning to weight bearing and walking needs to be done carefully. You will need to wait until the numbness is entirely gone and the  strength has returned. You should be able to move your leg and foot normally before you try and bear weight or walk. You will need someone to be with you when you first try to ensure you do not fall and possibly risk injury. ° °4. Bruising and tenderness at the needle site are common side effects and will resolve in a few days. ° °5. Persistent numbness or new problems with movement should be communicated to the surgeon or the [facility] ([phone])/ [facility] ([phone]). ° °

## 2019-02-02 NOTE — Progress Notes (Signed)
AssistedDr. Foster with right, ultrasound guided, adductor canal block. Side rails up, monitors on throughout procedure. See vital signs in flow sheet. Tolerated Procedure well.  

## 2019-02-02 NOTE — Anesthesia Preprocedure Evaluation (Addendum)
Anesthesia Evaluation  Patient identified by MRN, date of birth, ID band Patient awake    Reviewed: Allergy & Precautions, NPO status , Patient's Chart, lab work & pertinent test results  Airway Mallampati: II  TM Distance: >3 FB Neck ROM: Full    Dental  (+) Teeth Intact   Pulmonary Current Smoker,    Pulmonary exam normal breath sounds clear to auscultation       Cardiovascular negative cardio ROS Normal cardiovascular exam Rhythm:Regular Rate:Normal     Neuro/Psych negative neurological ROS  negative psych ROS   GI/Hepatic negative GI ROS, Neg liver ROS,   Endo/Other  Obesity  Renal/GU negative Renal ROS  negative genitourinary   Musculoskeletal Torn ACL and MCL right knee   Abdominal (+) + obese,   Peds  Hematology negative hematology ROS (+)   Anesthesia Other Findings   Reproductive/Obstetrics                             Anesthesia Physical Anesthesia Plan  ASA: II  Anesthesia Plan: General   Post-op Pain Management:    Induction: Intravenous  PONV Risk Score and Plan: 2 and Ondansetron, Treatment may vary due to age or medical condition and Midazolam  Airway Management Planned: LMA  Additional Equipment:   Intra-op Plan:   Post-operative Plan: Extubation in OR  Informed Consent: I have reviewed the patients History and Physical, chart, labs and discussed the procedure including the risks, benefits and alternatives for the proposed anesthesia with the patient or authorized representative who has indicated his/her understanding and acceptance.     Dental advisory given  Plan Discussed with: CRNA and Surgeon  Anesthesia Plan Comments:        Anesthesia Quick Evaluation

## 2019-02-02 NOTE — Anesthesia Postprocedure Evaluation (Signed)
Anesthesia Post Note  Patient: Suraj Ramdass  Procedure(s) Performed: RIGHT KNEE ARTHROSCOPY, LATERAL MENISCUS REPAIR, ANTERIOR CRUCIATE LIGAMENT RECONSTRUCTION ALLOGRAPH (Right Knee)     Patient location during evaluation: PACU Anesthesia Type: General Level of consciousness: awake and alert Pain management: pain level controlled Vital Signs Assessment: post-procedure vital signs reviewed and stable Respiratory status: spontaneous breathing, nonlabored ventilation and respiratory function stable Cardiovascular status: blood pressure returned to baseline and stable Postop Assessment: no apparent nausea or vomiting Anesthetic complications: no Comments: Given Labetalol intraop as well as in PACU for HTN.    Last Vitals:  Vitals:   02/02/19 1500 02/02/19 1511  BP: (!) 150/109 (!) 150/111  Pulse: 79 79  Resp: 18 15  Temp:    SpO2: 97% 98%    Last Pain:  Vitals:   02/02/19 1500  TempSrc:   PainSc: 0-No pain                 Lively Haberman A.

## 2019-02-02 NOTE — Op Note (Signed)
Date of Surgery: 02/02/2019  INDICATIONS: [name] is a 33 y.o.-year-old male with a right ACL tear as well as a bucket-handle lateral meniscus tear;  The patient did consent to the procedure after discussion of the risks and benefits.  PREOPERATIVE DIAGNOSIS:  1.  Complete rupture of right ACL 2.  Displaced bucket-handle tear lateral meniscus  POSTOPERATIVE DIAGNOSIS: Same.  PROCEDURE:  1.  Arthroscopic right ACL reconstruction with tibialis anterior allograft 2.  Arthroscopic right knee lateral meniscus repair 3.  Arthroscopic chondroplasty of right medial femoral condyle and lateral femoral condyle 4.  Arthroscopic right knee partial lateral meniscectomy  SURGEON: N. Eduard Roux, M.D.  ASSIST: Ciro Backer Pur[place], [place]; necessary for the timely completion of procedure and due to complexity of procedure.  ANESTHESIA:  general, femoral block  IV FLUIDS AND URINE: See anesthesia.  ESTIMATED BLOOD LOSS: Minimal mL.  IMPLANTS: ConMed sequent meniscus repair; Arthrex 9 x 30 mm interference screw  DRAINS: None  COMPLICATIONS: see description of procedure.  DESCRIPTION OF PROCEDURE: The patient was brought to the operating room and placed supine on the operating table.  The patient had been signed prior to the procedure and this was documented. The patient had the anesthesia placed by the anesthesiologist.  A time-out was performed to confirm that this was the correct patient, site, side and location. The patient did receive antibiotics prior to the incision and was re-dosed during the procedure as needed at indicated intervals.  A tourniquet was placed.  The patient had the operative extremity prepped and draped in the standard surgical fashion.  We first performed examination of the knee joint under anesthesia.  He exhibited a 2+ Lockman as well as a 2+ anterior drawer and positive pivot shift. Incisions were made for the anterior medial and anterolateral arthroscopy portals first.   A diagnostic knee arthroscopy was performed which confirmed the MRI findings of a complete ACL rupture and a displaced bucket-handle tear of posterior half of the lateral meniscus.  We first performed debridement of the ACL remnant with a oscillating shaver.  The tibial spine surface was prepared.  A notchplasty was performed using a high-speed bur to accommodate the graft.  After this was done we then placed the leg in a figure-of-four position in order to address the lateral meniscal tear.  Adherent tissue was debrided using oscillating shaver in order to mobilize the lateral meniscus.  With a probe the meniscus was then reduced back to the original position.  Irregular tears within the body of the meniscus were debrided using oscillating shaver.  Less than 5% of the lateral meniscus was resected.  I felt that the remaining meniscus was amenable to repair.  Using the ConMed sequence all inside meniscus repair I placed 3 anchors in the posterior horn of the lateral meniscus back to the capsule.  The lateral meniscal root was intact.  I then moved laterally just anterior to the popliteus tendon and placed 2 more anchors in the mid body of the lateral meniscus.  After this was done we then turned our attention to the ACL reconstruction.  A stab incision was made on the lateral aspect of the distal femur and the drill guide was inserted past the IT band down onto the bone.  The femoral tunnel was prepared with a 9 mm diameter back cutter with a 20 mm bony tunnel.  We then prepared the tibial tunnel by drilling a 9 mm reamer using a tibial guide.  After this was done a  9 mm allograft was passed through the tibia up into the femoral tunnel.  The button was flipped and confirmed to be deep to the IT band directly on the femoral cortex.  The graft was then advanced through the tibial tunnel across the joint and into the femoral tunnel to the appropriate depth.  The knee was then cycled through extension and flexion and  with the knee reduced and the graft tight and nitinol wire was advanced through the tibial bony tunnel and a 9 x 30 mm interference screw was advanced over the nitinol wire which gave excellent fixation.  The nitinol wire was removed.  Repeat examination of the knee joint revealed negative Lockman, negative anterior drawer, negative pivot shift.  Debris was removed from the knee joint with an oscillating shaver.  Excess fluid was removed.  Gentle chondroplasty was performed of the femoral condyles.  The incisions were closed in a layered fashion.  Sterile dressings were applied.  Knee immobilizer was placed.  Patient tolerated procedure well had no immediate complications.  POSTOPERATIVE PLAN: Weight-bear as tolerated in knee immobilizer.  Follow-up in the office in 1 week for suture removal and initiation of physical therapy.  Dennis ReelN. Michael Shabria Egley, MD [facility] [phone] 1:40 PM

## 2019-02-02 NOTE — Transfer of Care (Signed)
Immediate Anesthesia Transfer of Care Note  Patient: Dennis Hayden  Procedure(s) Performed: RIGHT KNEE ARTHROSCOPY, LATERAL MENISCUS REPAIR, ANTERIOR CRUCIATE LIGAMENT RECONSTRUCTION (Right Knee)  Patient Location: PACU  Anesthesia Type:General and Regional  Level of Consciousness: drowsy  Airway & Oxygen Therapy: Patient Spontanous Breathing and Patient connected to face mask oxygen  Post-op Assessment: Report given to RN and Post -op Vital signs reviewed and stable  Post vital signs: Reviewed and stable  Last Vitals:  Vitals Value Taken Time  BP 129/85 02/02/19 1353  Temp    Pulse 79 02/02/19 1356  Resp 12 02/02/19 1356  SpO2 100 % 02/02/19 1356  Vitals shown include unvalidated device data.  Last Pain:  Vitals:   02/02/19 1102  TempSrc: Oral  PainSc: 0-No pain         Complications: No apparent anesthesia complications

## 2019-02-02 NOTE — Anesthesia Procedure Notes (Signed)
Anesthesia Regional Block: Adductor canal block   Pre-Anesthetic Checklist: ,, timeout performed, Correct Patient, Correct Site, Correct Laterality, Correct Procedure, Correct Position, site marked, Risks and benefits discussed,  Surgical consent,  Pre-op evaluation,  At surgeon's request and post-op pain management  Laterality: Right  Prep: chloraprep       Needles:  Injection technique: Single-shot  Needle Type: Echogenic Stimulator Needle     Needle Length: 9cm  Needle Gauge: 21   Needle insertion depth: 7 cm   Additional Needles:   Procedures:,,,, ultrasound used (permanent image in chart),,,,  Narrative:  Start time: 02/02/2019 11:09 AM End time: 02/02/2019 11:13 AM Injection made incrementally with aspirations every 5 mL.  Performed by: Personally  Anesthesiologist: Josephine Igo, MD  Additional Notes: Timeout performed. Patient sedated. Relevant anatomy ID'd using [place]. Incremental 2-80ml injection of LA with frequent aspiration. Patient tolerated procedure well.        Left Adductor Canal Block

## 2019-02-02 NOTE — Anesthesia Procedure Notes (Signed)
Procedure Name: LMA Insertion Date/Time: 02/02/2019 11:44 AM Performed by: Gwyndolyn Saxon, CRNA Pre-anesthesia Checklist: Patient identified, Emergency Drugs available, Suction available and Patient being monitored Patient Re-evaluated:Patient Re-evaluated prior to induction Oxygen Delivery Method: Circle system utilized Preoxygenation: Pre-oxygenation with 100% oxygen Induction Type: IV induction Ventilation: Mask ventilation without difficulty LMA: LMA inserted LMA Size: 5.0 Number of attempts: 1 Airway Equipment and Method: Patient positioned with wedge pillow Placement Confirmation: positive ETCO2 and breath sounds checked- equal and bilateral Tube secured with: Tape Dental Injury: Teeth and Oropharynx as per pre-operative assessment

## 2019-02-02 NOTE — H&P (Signed)
    PREOPERATIVE H&P  Chief Complaint: Right Knee Anterior Cruciate Ligament Tear and Lateral Meniscus Tear  HPI: Dennis Hayden is a 33 y.o. male who presents for surgical treatment of Right Knee Anterior Cruciate Ligament Tear and Lateral Meniscus Tear.  He denies any changes in medical history.  Past Medical History:  Diagnosis Date  . No pertinent past medical history    Past Surgical History:  Procedure Laterality Date  . ANTERIOR CRUCIATE LIGAMENT REPAIR     Social History   Socioeconomic History  . Marital status: Single    Spouse name: Not on file  . Number of children: Not on file  . Years of education: Not on file  . Highest education level: Not on file  Occupational History  . Not on file  Social Needs  . Financial resource strain: Not on file  . Food insecurity    Worry: Not on file    Inability: Not on file  . Transportation needs    Medical: Not on file    Non-medical: Not on file  Tobacco Use  . Smoking status: Current Every Day Smoker    Packs/day: 1.00    Types: Cigars, Cigarettes  . Smokeless tobacco: Never Used  Substance and Sexual Activity  . Alcohol use: Yes  . Drug use: Yes    Types: Marijuana    Comment: occas  . Sexual activity: Yes    Birth control/protection: None  Lifestyle  . Physical activity    Days per week: Not on file    Minutes per session: Not on file  . Stress: Not on file  Relationships  . Social Herbalist on phone: Not on file    Gets together: Not on file    Attends religious service: Not on file    Active member of club or organization: Not on file    Attends meetings of clubs or organizations: Not on file    Relationship status: Not on file  Other Topics Concern  . Not on file  Social History Narrative  . Not on file   History reviewed. No pertinent family history. No Known Allergies Prior to Admission medications   Not on File     Positive ROS: All other systems have been reviewed and were  otherwise negative with the exception of those mentioned in the HPI and as above.  Physical Exam: General: Alert, no acute distress Cardiovascular: No pedal edema Respiratory: No cyanosis, no use of accessory musculature GI: abdomen soft Skin: No lesions in the area of chief complaint Neurologic: Sensation intact distally Psychiatric: Patient is competent for consent with normal mood and affect Lymphatic: no lymphedema  MUSCULOSKELETAL: exam stable  Assessment: Right Knee Anterior Cruciate Ligament Tear and Lateral Meniscus Tear  Plan: Plan for Procedure(s): RIGHT KNEE ARTHROSCOPY, LATERAL MENISCUS REPAIR, ANTERIOR CRUCIATE LIGAMENT RECONSTRUCTION  The risks benefits and alternatives were discussed with the patient including but not limited to the risks of nonoperative treatment, versus surgical intervention including infection, bleeding, nerve injury,  blood clots, cardiopulmonary complications, morbidity, mortality, among others, and they were willing to proceed.   Eduard Roux, MD   02/02/2019 8:03 AM

## 2019-02-03 ENCOUNTER — Encounter (HOSPITAL_BASED_OUTPATIENT_CLINIC_OR_DEPARTMENT_OTHER): Payer: Self-pay | Admitting: Orthopaedic Surgery

## 2019-02-03 NOTE — Addendum Note (Signed)
Addendum  created 02/03/19 1346 by Tawni Millers, CRNA   Charge Capture section accepted

## 2019-02-09 ENCOUNTER — Ambulatory Visit (INDEPENDENT_AMBULATORY_CARE_PROVIDER_SITE_OTHER): Payer: Self-pay | Admitting: Physician Assistant

## 2019-02-09 ENCOUNTER — Encounter: Payer: Self-pay | Admitting: Physician Assistant

## 2019-02-09 ENCOUNTER — Other Ambulatory Visit: Payer: Self-pay

## 2019-02-09 DIAGNOSIS — Z9889 Other specified postprocedural states: Secondary | ICD-10-CM

## 2019-02-09 MED ORDER — TRAMADOL HCL 50 MG PO TABS: 50.0000 mg | ORAL_TABLET | Freq: Four times a day (QID) | ORAL | 1 refills | Status: DC | PRN

## 2019-02-09 NOTE — Progress Notes (Signed)
   Post-Op Visit Note   Patient: Dennis Hayden           Date of Birth: 08-Dec-1985           MRN: [id number] Visit Date: 02/09/2019 PCP: Patient, No Pcp Per   Assessment & Plan:  Chief Complaint:  Chief Complaint  Patient presents with  . Right Knee - Routine Post Op    1 week postop    Visit Diagnoses:  1. S/P ACL reconstruction   2. Status post medial meniscus repair     Plan: Patient is a pleasant 33 year old gentleman who presents our clinic today 7 days status post right knee arthroscopic lateral meniscus repair and ACL reconstruction, date of surgery 02/02/2019.  He has been doing well.  He comes in today without his knee immobilizer.  He states that he took this off in the car to drive himself to his appointment.  He has minimal pain.  No fevers or chills.  Examination of his right knee reveals well-healing surgical portals with nylon sutures in place.  No evidence of infection or cellulitis.  Calf soft nontender.  Today, nylon sutures were removed.  I have reinforced the need for the patient to wear his knee immobilizer until he has sufficient quad strength.  He can remain weightbearing as tolerated.  We will start him in formal physical therapy per ACL reconstruction protocol.  Prescription was provided to the patient today for this.  He will follow-up with [place] in 5 weeks time for repeat evaluation.  Follow-Up Instructions: Return in about 5 weeks (around 03/16/2019).   Orders:  No orders of the defined types were placed in this encounter.  Meds ordered this encounter  Medications  . traMADol (ULTRAM) 50 MG tablet    Sig: Take 1 tablet (50 mg total) by mouth every 6 (six) hours as needed.    Dispense:  30 tablet    Refill:  1    Imaging: No new imaging   PMFS History: Patient Active Problem List   Diagnosis Date Noted  . Complete tear of right ACL, initial encounter 02/02/2019  . Bucket handle tear of lateral meniscus of right knee, initial encounter 02/02/2019   Past  Medical History:  Diagnosis Date  . No pertinent past medical history     History reviewed. No pertinent family history.  Past Surgical History:  Procedure Laterality Date  . ANTERIOR CRUCIATE LIGAMENT REPAIR    . KNEE ARTHROSCOPY WITH ANTERIOR CRUCIATE LIGAMENT (ACL) REPAIR Right 02/02/2019   Procedure: RIGHT KNEE ARTHROSCOPY, LATERAL MENISCUS REPAIR, ANTERIOR CRUCIATE LIGAMENT RECONSTRUCTION ALLOGRAPH;  Surgeon: Leandrew Koyanagi, MD;  Location: [facility];  Service: Orthopedics;  Laterality: Right;   Social History   Occupational History  . Not on file  Tobacco Use  . Smoking status: Current Every Day Smoker    Packs/day: 1.00    Types: Cigars, Cigarettes  . Smokeless tobacco: Never Used  Substance and Sexual Activity  . Alcohol use: Yes  . Drug use: Yes    Types: Marijuana    Comment: occas  . Sexual activity: Yes    Birth control/protection: None

## 2019-03-02 ENCOUNTER — Ambulatory Visit: Payer: Self-pay | Admitting: Orthopaedic Surgery

## 2019-03-06 ENCOUNTER — Telehealth: Payer: Self-pay | Admitting: Physician Assistant

## 2019-03-06 NOTE — Telephone Encounter (Signed)
Patient called stating he have not been to (PT) and want to be set up for it. The number to contact patient is [phone]

## 2019-03-06 NOTE — Telephone Encounter (Signed)
He was given a rx for PT.  Do you know if just didn't call to set up?

## 2019-03-06 NOTE — Telephone Encounter (Signed)
Please advise 

## 2019-03-08 NOTE — Telephone Encounter (Signed)
I left voicemail for patient advising he could call PT place on script given to him to schedule. I asked for return call if he needs a new PT script.

## 2019-03-14 ENCOUNTER — Encounter: Payer: Self-pay | Admitting: Orthopaedic Surgery

## 2019-03-14 ENCOUNTER — Ambulatory Visit (INDEPENDENT_AMBULATORY_CARE_PROVIDER_SITE_OTHER): Payer: Self-pay | Admitting: Orthopaedic Surgery

## 2019-03-14 DIAGNOSIS — S83251A Bucket-handle tear of lateral meniscus, current injury, right knee, initial encounter: Secondary | ICD-10-CM

## 2019-03-14 DIAGNOSIS — Z9889 Other specified postprocedural states: Secondary | ICD-10-CM

## 2019-03-14 NOTE — Addendum Note (Signed)
Addended by: Lendon Collar on: 03/14/2019 11:17 AM   Modules accepted: Orders

## 2019-03-14 NOTE — Progress Notes (Signed)
Patient ID: Dennis Hayden, male   DOB: 08/07/1986, 33 y.o.   MRN: [id number]  [name] is a 33 year old gentleman who is 6 weeks status post right ACL reconstruction lateral meniscus repair.  He is doing well overall.  He has not started any physical therapy but overall his range of motion has improved significantly.  His surgical scars are fully healed.  His range of motion is 0 to 120 degrees.  ACL testing is stable.  He does have a small effusion. Overall very happy with his progress and improvement.  We will fax an in order to [facility] outpatient physical therapy so that he can start rehab officially.  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  I would like to recheck him in 6 weeks.

## 2019-03-21 ENCOUNTER — Telehealth: Payer: Self-pay | Admitting: Orthopaedic Surgery

## 2019-03-21 NOTE — Telephone Encounter (Signed)
Patient called asked where will he be going for his (PT) The number too contact patient is [phone]

## 2019-03-21 NOTE — Telephone Encounter (Signed)
IC patient and advised He will call PT to set up an appointment.

## 2019-03-21 NOTE — Telephone Encounter (Signed)
Per referral it is [facility] outpatient on [facility].  Note on referral states that they have tried to call patient but is phone number is not accepting calls. I will try to reach patient to advise.

## 2019-03-30 ENCOUNTER — Encounter: Payer: Self-pay | Admitting: Physical Therapy

## 2019-03-30 ENCOUNTER — Other Ambulatory Visit: Payer: Self-pay

## 2019-03-30 ENCOUNTER — Ambulatory Visit: Payer: Self-pay | Attending: Orthopaedic Surgery | Admitting: Physical Therapy

## 2019-03-30 DIAGNOSIS — M25561 Pain in right knee: Secondary | ICD-10-CM | POA: Insufficient documentation

## 2019-03-30 DIAGNOSIS — M6281 Muscle weakness (generalized): Secondary | ICD-10-CM | POA: Insufficient documentation

## 2019-03-30 NOTE — Therapy (Signed)
[facility] [address] [place], [place], [address] Phone: [phone]   Fax:  [phone]  Physical Therapy Treatment  Patient Details  Name: Dennis CrutchJames Hayden MRN: [id number] Date of Birth: June 22, 1986 Referring Provider (PT): Gershon MusselNaiping Xu, MD   Encounter Date: 03/30/2019  PT End of Session - 03/30/19 1052    Visit Number  1    Number of Visits  16    Date for PT Re-Evaluation  05/25/19    Authorization Type  self-pay    PT Start Time  1022    PT Stop Time  1056    PT Time Calculation (min)  34 min    Activity Tolerance  Patient tolerated treatment well    Behavior During Therapy  [facility] for tasks assessed/performed       Past Medical History:  Diagnosis Date  . No pertinent past medical history     Past Surgical History:  Procedure Laterality Date  . ANTERIOR CRUCIATE LIGAMENT REPAIR    . KNEE ARTHROSCOPY WITH ANTERIOR CRUCIATE LIGAMENT (ACL) REPAIR Right 02/02/2019   Procedure: RIGHT KNEE ARTHROSCOPY, LATERAL MENISCUS REPAIR, ANTERIOR CRUCIATE LIGAMENT RECONSTRUCTION ALLOGRAPH;  Surgeon: Tarry KosXu, Naiping M, MD;  Location: [facility];  Service: Orthopedics;  Laterality: Right;    There were no vitals filed for this visit.  Subjective Assessment - 03/30/19 1026    Subjective  Pt. sustained right knee injury with tibial plateau fracture secondary to fall at the end of last March. MRI revealed ACL rupture so patient underwent surgery to address. He is current WBAT and out of brace. He normally works in [employer] with heavy lifting requirements (reports has to lift tires 75-100 lbs.) so has been unable to work. He reports possible return planned in 4 weeks pending MD follow up/clearance to return.    Pertinent History  L ACL surgery 9 years ago    Limitations  Lifting;Standing;Walking    Diagnostic tests  MRI, CT scan, X-rays    Patient Stated Goals  Return to work    Currently in Pain?  No/denies         [facility]  Assessment - 03/30/19 0001      Assessment   Medical Diagnosis  s/p right ACL reconstruction (tibialis anterior autograft), meniscus repair    Referring Provider (PT)  Gershon MusselNaiping Xu, MD    Onset Date/Surgical Date  02/02/19    Hand Dominance  Right    Prior Therapy  none      Precautions   Precaution Comments  see protocol      Balance Screen   Has the patient fallen in the past 6 months  Yes   fall with initial injury   How many times?  1      Home Environment   Living Environment  Private residence    Living Arrangements  Spouse/significant other    Additional Comments  lives in apartment with 3 flight of stairs to enter, no stairs inside      Prior Function   Level of Independence  Independent with community mobility without device    Vocation  --   works in [employer] but currently unable to work   [employer] Requirements  --   reports has to lift tires 75-100 lbs.     Cognition   Overall Cognitive Status  Within Functional Limits for tasks assessed      Observation/Other Assessments   Observations  --   quad atrophy on right   Skin Integrity  --  incisions well-healed, no signs of infection   Focus on Therapeutic Outcomes (FOTO)   31% limited      Observation/Other Assessments-Edema    Edema  Circumferential      Circumferential Edema   Circumferential - Right  40.5 cm    Circumferential - Left   39.5 cm      Sensation   Light Touch  Appears Intact      ROM / Strength   AROM / PROM / Strength  AROM;Strength      AROM   Overall AROM Comments  Hip AROM/PROM grossly WFL    AROM Assessment Site  Knee    Right/Left Knee  Right;Left    Right Knee Extension  0    Right Knee Flexion  120    Left Knee Extension  0    Left Knee Flexion  138      Strength   Overall Strength Comments  right side MMTs not tested given recent post-op status    Strength Assessment Site  Hip;Knee    Right/Left Hip  Right;Left    Left Hip Flexion  5/5    Left Hip Extension  5/5     Left Hip External Rotation  5/5    Left Hip Internal Rotation  5/5    Left Hip ABduction  5/5    Left Hip ADduction  5/5    Right/Left Knee  Right;Left    Left Knee Flexion  5/5    Left Knee Extension  5/5      Ambulation/Gait   Gait Comments  no significant gait deviations noted                   [facility] Adult PT Treatment/Exercise - 03/30/19 0001      Exercises   Exercises  Knee/Hip      Knee/Hip Exercises: Standing   Terminal Knee Extension Limitations  1x10 with blue Theraband-issued Theraband for HEP    Other Standing Knee Exercises  additional HEP instruction partial squats up to 45 deg and heel raises      Knee/Hip Exercises: Supine   Quad Sets  AROM;Right;10 reps    Straight Leg Raises  AROM;Right;10 reps    Other Supine Knee/Hip Exercises  reviewed heel slides for HEP      Knee/Hip Exercises: Sidelying   Hip ABduction  AROM;Right;10 reps    Hip ADduction  AROM;Right;10 reps      Knee/Hip Exercises: Prone   Hip Extension  AROM;Right;10 reps             PT Education - 03/30/19 1036    Education Details  HEP, POC    Person(s) Educated  Patient    Methods  Explanation;Demonstration;Verbal cues;Handout    Comprehension  Verbalized understanding;Returned demonstration       PT Short Term Goals - 03/30/19 1247      PT SHORT TERM GOAL #1   Title  Independent with HEP    Baseline  needs HEP update    Time  4    Period  Weeks    Status  New      PT SHORT TERM GOAL #2   Title  Right quad strength 4+/5 (avoid testing in end-range extension) for improved strength to assist return to work    Baseline  MMT not tested given recent post-op status    Time  4    Period  Weeks    Status  New        PT Long  Term Goals - 03/30/19 1249      PT LONG TERM GOAL #1   Title  Improve FOTO outcome measure score to 12% or less impairment    Baseline  31%    Time  8    Period  Weeks    Status  New    Target Date  05/25/19      PT LONG TERM GOAL #2    Title  Right quad strength 5/5 to improve ability for lifting activities for work    Time  8    Period  Weeks    Status  New    Target Date  05/25/19      PT LONG TERM GOAL #3   Title  Pt. to tolerate standing for IADLs and work duties (pending MD clearance to return) at least 1 hour or greater without limitation due to knee pain    Time  8    Period  Weeks    Status  New    Target Date  05/25/19            Plan - 03/30/19 1157    Clinical Impression Statement  Pt. doing well for timeframe post-op 8 weeks s/p right ACL reconstruction with meniscus repair surgery. Stiffness for knee ROM is minimal-primary limitations at this point are associated with right LE/quadricep weakness with inability to work previous job due to lifting requirements. Pt. would benefit from PT to improve strength and address current associated functional limitations,    Personal Factors and Comorbidities  --   self-pay   Examination-Activity Limitations  Lift;Squat;Stairs;Stand;Locomotion Level    Examination-Participation Restrictions  Community Activity;Other   unable to work   Stability/Clinical Decision Making  Stable/Uncomplicated    Clinical Decision Making  Low    Rehab Potential  Excellent    PT Frequency  --   2-3x/week   PT Duration  8 weeks    PT Treatment/Interventions  ADLs/Self Care Home Management;Cryotherapy;Electrical Stimulation;Functional mobility training;Therapeutic activities;Therapeutic exercise;Gait training;Stair training;Balance training;Patient/family education;Manual techniques;Passive range of motion;Vasopneumatic Device;Taping;Neuromuscular re-education    PT Next Visit Plan  review HEP as needed, trial recumbent bike, progress quad and hip strengthening as tolerated, add further closed chain strengthening as tolerated-see protocol for reference as needed    PT Home Exercise Plan  quad sets, heel slides, 4-way SLR, heel raises, partial squats    Consulted and Agree with Plan of  Care  Patient       Patient will benefit from skilled therapeutic intervention in order to improve the following deficits and impairments:  Pain, Decreased strength, Decreased range of motion, Increased edema, Decreased balance  Visit Diagnosis: Acute pain of right knee  Muscle weakness (generalized)     Problem List Patient Active Problem List   Diagnosis Date Noted  . Complete tear of right ACL, initial encounter 02/02/2019  . Bucket handle tear of lateral meniscus of right knee, initial encounter 02/02/2019    Beaulah Dinning, PT, DPT 03/30/19 12:52 PM  [facility] [address] [place], [place], [address] Phone: [phone]   Fax:  [phone]  Name: Dennis Hayden MRN: [id number] Date of Birth: 04-30-1986

## 2019-04-04 ENCOUNTER — Ambulatory Visit: Payer: Self-pay | Admitting: Physical Therapy

## 2019-04-04 ENCOUNTER — Encounter: Payer: Self-pay | Admitting: Physical Therapy

## 2019-04-04 ENCOUNTER — Other Ambulatory Visit: Payer: Self-pay

## 2019-04-04 DIAGNOSIS — M25561 Pain in right knee: Secondary | ICD-10-CM

## 2019-04-04 DIAGNOSIS — M6281 Muscle weakness (generalized): Secondary | ICD-10-CM

## 2019-04-04 NOTE — Therapy (Signed)
[name], [place], [address] Phone: [phone]   Fax:  [phone]  Physical Therapy Treatment  Patient Details  Name: Dennis Hayden MRN: [id number] Date of Birth: Jun 23, 1986 Referring Provider (PT): Frankey Shown, MD   Encounter Date: 04/04/2019  PT End of Session - 04/04/19 0813    Visit Number  2    Number of Visits  16    Date for PT Re-Evaluation  05/25/19    Authorization Type  self-pay    PT Start Time  0748    PT Stop Time  0829    PT Time Calculation (min)  41 min    Activity Tolerance  Patient tolerated treatment well    Behavior During Therapy  [facility] for tasks assessed/performed       Past Medical History:  Diagnosis Date  . No pertinent past medical history     Past Surgical History:  Procedure Laterality Date  . ANTERIOR CRUCIATE LIGAMENT REPAIR    . KNEE ARTHROSCOPY WITH ANTERIOR CRUCIATE LIGAMENT (ACL) REPAIR Right 02/02/2019   Procedure: RIGHT KNEE ARTHROSCOPY, LATERAL MENISCUS REPAIR, ANTERIOR CRUCIATE LIGAMENT RECONSTRUCTION ALLOGRAPH;  Surgeon: Leandrew Koyanagi, MD;  Location: [place];  Service: Orthopedics;  Laterality: Right;    There were no vitals filed for this visit.  Subjective Assessment - 04/04/19 0753    Subjective  No complaints today, feels stiff from time to time.  Does his HEP everyday.    Currently in Pain?  No/denies         [facility] PT Assessment - 04/04/19 0001      AROM   Right Knee Extension  0    Right Knee Flexion  125      Strength   Overall Strength Comments  6 deg quad lag with SLR          [facility] Adult PT Treatment/Exercise - 04/04/19 0001      Knee/Hip Exercises: Stretches   Active Hamstring Stretch  Right;3 reps;30 seconds    Knee: Self-Stretch to increase Flexion  Right;3 reps    Gastroc Stretch  Both;3 reps      Knee/Hip Exercises: Aerobic   Stationary Bike  6 min L2       Knee/Hip Exercises: Standing   Heel Raises   Both;1 set;20 reps    Terminal Knee Extension  Strengthening;Right;1 set;15 reps    Terminal Knee Extension Limitations  then ball press into wall x 10 , 5 sec     Functional Squat  3 sets;10 reps    Functional Squat Limitations  10 lbs KB for last set     Wall Squat  1 set;10 reps    Wall Squat Limitations  added heel lifts  x 10    SLS  30 sec static bilat.       Knee/Hip Exercises: Supine   Quad Sets  Strengthening;Right;1 set;20 reps    Straight Leg Raises  Strengthening;Right;1 set;20 reps    Other Supine Knee/Hip Exercises  4 way hip x 20 Rt LE       Knee/Hip Exercises: Sidelying   Hip ABduction  Strengthening;Right;1 set    Hip ADduction  Strengthening;Right;1 set             PT Education - 04/04/19 0817    Education Details  HEP cues, alignment    Person(s) Educated  Patient    Methods  Explanation    Comprehension  Verbalized understanding;Returned demonstration  PT Short Term Goals - 04/04/19 0813      PT SHORT TERM GOAL #1   Title  Independent with HEP    Status  On-going      PT SHORT TERM GOAL #2   Title  Right quad strength 4+/5 (avoid testing in end-range extension) for improved strength to assist return to work    Status  On-going        PT Long Term Goals - 04/04/19 0814      PT LONG TERM GOAL #1   Title  Improve FOTO outcome measure score to 12% or less impairment    Status  On-going      PT LONG TERM GOAL #2   Title  Right quad strength 5/5 to improve ability for lifting activities for work    Status  On-going      PT LONG TERM GOAL #3   Title  Pt. to tolerate standing for IADLs and work duties (pending MD clearance to return) at least 1 hour or greater without limitation due to knee pain    Status  On-going            Plan - 04/04/19 0815    Clinical Impression Statement  Patient doing very well, limitations in quad strength evident with SLR performance.  Squatting with min cue for alignment in LEs and trunk.  Added 3 lb  cuff wgts to SLR today.  no further HEP given.    PT Treatment/Interventions  ADLs/Self Care Home Management;Cryotherapy;Electrical Stimulation;Functional mobility training;Therapeutic activities;Therapeutic exercise;Gait training;Stair training;Balance training;Patient/family education;Manual techniques;Passive range of motion;Vasopneumatic Device;Taping;Neuromuscular re-education    PT Next Visit Plan  review HEP as needed, trial recumbent bike, progress quad and hip strengthening as tolerated, add further closed chain strengthening as tolerated-see protocol for reference as needed    PT Home Exercise Plan  quad sets, heel slides, 4-way SLR, heel raises, partial squats    Consulted and Agree with Plan of Care  Patient       Patient will benefit from skilled therapeutic intervention in order to improve the following deficits and impairments:  Pain, Decreased strength, Decreased range of motion, Increased edema, Decreased balance  Visit Diagnosis: Acute pain of right knee  Muscle weakness (generalized)     Problem List Patient Active Problem List   Diagnosis Date Noted  . Complete tear of right ACL, initial encounter 02/02/2019  . Bucket handle tear of lateral meniscus of right knee, initial encounter 02/02/2019    PAA,JENNIFER 04/04/2019, 8:31 AM  [facility] [address] [place] [employer] [place], [place], [address] Phone: [phone]   Fax:  [phone]  Name: Dennis Hayden MRN: [id number] Date of Birth: 14-Sep-1985  Karie MainlandJennifer Paa, PT 04/04/19 8:31 AM Phone: [phone] Fax: [phone]

## 2019-04-05 ENCOUNTER — Ambulatory Visit: Payer: Self-pay | Admitting: Physical Therapy

## 2019-04-05 ENCOUNTER — Encounter: Payer: Self-pay | Admitting: Physical Therapy

## 2019-04-05 DIAGNOSIS — M6281 Muscle weakness (generalized): Secondary | ICD-10-CM

## 2019-04-05 DIAGNOSIS — M25561 Pain in right knee: Secondary | ICD-10-CM

## 2019-04-05 NOTE — Therapy (Signed)
[place], [place], [address] Phone: [phone]   Fax:  [phone]  Physical Therapy Treatment  Patient Details  Name: Dennis Hayden MRN: [id number] Date of Birth: 1985/09/02 Referring Provider (PT): Frankey Shown, MD   Encounter Date: 04/05/2019  PT End of Session - 04/05/19 0755    Visit Number  3    Number of Visits  16    Date for PT Re-Evaluation  05/25/19    Authorization Type  self-pay    PT Start Time  0751    PT Stop Time  0840    PT Time Calculation (min)  49 min    Activity Tolerance  Patient tolerated treatment well    Behavior During Therapy  [facility] for tasks assessed/performed       Past Medical History:  Diagnosis Date  . No pertinent past medical history     Past Surgical History:  Procedure Laterality Date  . ANTERIOR CRUCIATE LIGAMENT REPAIR    . KNEE ARTHROSCOPY WITH ANTERIOR CRUCIATE LIGAMENT (ACL) REPAIR Right 02/02/2019   Procedure: RIGHT KNEE ARTHROSCOPY, LATERAL MENISCUS REPAIR, ANTERIOR CRUCIATE LIGAMENT RECONSTRUCTION ALLOGRAPH;  Surgeon: Leandrew Koyanagi, MD;  Location: [place];  Service: Orthopedics;  Laterality: Right;    There were no vitals filed for this visit.  Subjective Assessment - 04/05/19 0755    Subjective  No pain today, min stiffness    Currently in Pain?  No/denies             [facility] Adult PT Treatment/Exercise - 04/05/19 0001      Knee/Hip Exercises: Stretches   Active Hamstring Stretch  Right;3 reps    Knee: Self-Stretch to increase Flexion  Right;10 seconds    Knee: Self-Stretch Limitations  x 10 with sheet     ITB Stretch  Right;2 reps    ITB Stretch Limitations  x 30 sec     Piriformis Stretch  Right;3 reps    Piriformis Stretch Limitations  figure 4       Knee/Hip Exercises: Aerobic   Elliptical  5 min L 10 ramp, L 1 resist      Knee/Hip Exercises: Machines for Strengthening   Cybex Leg Press  x 20 x 3, used 1-3 plates, x  10 with single leg     Other Machine  heel raises bilateral 2 plates x 20 x 2 , 1 set with knees sl bent       Knee/Hip Exercises: Standing   Heel Raises  Right;1 set;20 reps    Hip Abduction  Stengthening;Both;1 set;20 reps    Abduction Limitations  for balance light 1 UE asssit     Lateral Step Up  Right;1 set;20 reps    Forward Step Up  Right;1 set;20 reps    Functional Squat  1 set    Functional Squat Limitations  on foam x 15     Walking with Sports Cord  single leg RDL and double leg x 15 lbs x 15     Gait Training  increased time, used wall fdor dead lift technique     Other Standing Knee Exercises  SLS on foam pad , dynamic     Other Standing Knee Exercises  march on foam                PT Short Term Goals - 04/04/19 0813      PT SHORT TERM GOAL #1   Title  Independent with HEP  Status  On-going      PT SHORT TERM GOAL #2   Title  Right quad strength 4+/5 (avoid testing in end-range extension) for improved strength to assist return to work    Status  On-going        PT Long Term Goals - 04/04/19 0814      PT LONG TERM GOAL #1   Title  Improve FOTO outcome measure score to 12% or less impairment    Status  On-going      PT LONG TERM GOAL #2   Title  Right quad strength 5/5 to improve ability for lifting activities for work    Status  On-going      PT LONG TERM GOAL #3   Title  Pt. to tolerate standing for IADLs and work duties (pending MD clearance to return) at least 1 hour or greater without limitation due to knee pain    Status  On-going            Plan - 04/05/19 1014    Clinical Impression Statement  Worked on single leg stability, balance and control today in standing.  Difficulty maintaining a flat back for hinge exercises , mod cues. Stressed importance of lifting techniques for return to work. Progressing towards goals.    PT Treatment/Interventions  ADLs/Self Care Home Management;Cryotherapy;Electrical Stimulation;Functional mobility  training;Therapeutic activities;Therapeutic exercise;Gait training;Stair training;Balance training;Patient/family education;Manual techniques;Passive range of motion;Vasopneumatic Device;Taping;Neuromuscular re-education    PT Next Visit Plan  review HEP as needed, trial recumbent bike, progress quad and hip strengthening as tolerated, add further closed chain strengthening as tolerated-see protocol for reference as needed    PT Home Exercise Plan  quad sets, heel slides, 4-way SLR, heel raises, partial squats    Consulted and Agree with Plan of Care  Patient       Patient will benefit from skilled therapeutic intervention in order to improve the following deficits and impairments:  Pain, Decreased strength, Decreased range of motion, Increased edema, Decreased balance  Visit Diagnosis: Acute pain of right knee  Muscle weakness (generalized)     Problem List Patient Active Problem List   Diagnosis Date Noted  . Complete tear of right ACL, initial encounter 02/02/2019  . Bucket handle tear of lateral meniscus of right knee, initial encounter 02/02/2019    , 04/05/2019, 10:16 AM  [facility] [address] [place], [place], [address] Phone: [phone]   Fax:  [phone]  Name: Dennis Hayden MRN: [id number] Date of Birth: [birth year]  Karie MainlandJennifer , PT 04/05/19 10:17 AM Phone: [phone] Fax: [phone]

## 2019-04-07 ENCOUNTER — Encounter

## 2019-04-10 ENCOUNTER — Other Ambulatory Visit: Payer: Self-pay

## 2019-04-10 ENCOUNTER — Ambulatory Visit: Payer: Self-pay

## 2019-04-10 DIAGNOSIS — M6281 Muscle weakness (generalized): Secondary | ICD-10-CM

## 2019-04-10 DIAGNOSIS — M25561 Pain in right knee: Secondary | ICD-10-CM

## 2019-04-10 NOTE — Therapy (Signed)
[facility] [address] [place], [place], [address] Phone: [phone]   Fax:  [phone]  Physical Therapy Treatment  Patient Details  Name: Dennis Hayden MRN: [id number] Date of Birth: Dec 19, 1985 Referring Provider (PT): Gershon MusselNaiping Xu, MD   Encounter Date: 04/10/2019  PT End of Session - 04/10/19 0757    Visit Number  4    Number of Visits  16    Date for PT Re-Evaluation  05/25/19    Authorization Type  self-pay    PT Start Time  0757   pt late for 745 sppointment   PT Stop Time  0830    PT Time Calculation (min)  33 min    Activity Tolerance  Patient tolerated treatment well    Behavior During Therapy  [facility] for tasks assessed/performed       Past Medical History:  Diagnosis Date  . No pertinent past medical history     Past Surgical History:  Procedure Laterality Date  . ANTERIOR CRUCIATE LIGAMENT REPAIR    . KNEE ARTHROSCOPY WITH ANTERIOR CRUCIATE LIGAMENT (ACL) REPAIR Right 02/02/2019   Procedure: RIGHT KNEE ARTHROSCOPY, LATERAL MENISCUS REPAIR, ANTERIOR CRUCIATE LIGAMENT RECONSTRUCTION ALLOGRAPH;  Surgeon: Tarry KosXu, Naiping M, MD;  Location: [facility];  Service: Orthopedics;  Laterality: Right;    There were no vitals filed for this visit.  Subjective Assessment - 04/10/19 0831    Subjective  No pain , doing well , asked about running    Currently in Pain?  No/denies                       [facility]/Exercise - 04/10/19 0001      Knee/Hip Exercises: Aerobic   Elliptical  6 min L5 R 10      Knee/Hip Exercises: Standing   Heel Raises  Right;1 set;20 reps    Abduction Limitations  for balance light 1 UE asssit red band    Lateral Step Up  Right;1 set;20 reps;Step Height: 8";Hand Hold: 1    Forward Step Up  Right;20 reps;Hand Hold: 0    Forward Step Up Limitations  12 inch step    Functional Squat  20 reps    Functional Squat Limitations  on BOSU inverted    Other  Standing Knee Exercises  double leg dead lift 25 pounds x 20 reps,  single leg hip hinge x 25 working on technique.              PT Education - 04/10/19 0828    Education Details  single leg hip hinge technique    Person(s) Educated  Patient    Methods  Explanation;Demonstration    Comprehension  Verbalized understanding;Returned demonstration       PT Short Term Goals - 04/04/19 0813      PT SHORT TERM GOAL #1   Title  Independent with HEP    Status  On-going      PT SHORT TERM GOAL #2   Title  Right quad strength 4+/5 (avoid testing in end-range extension) for improved strength to assist return to work    Status  On-going        PT Long Term Goals - 04/04/19 0814      PT LONG TERM GOAL #1   Title  Improve FOTO outcome measure score to 12% or less impairment    Status  On-going      PT LONG TERM GOAL #2   Title  Right quad strength 5/5 to improve ability for lifting activities for work    Status  On-going      PT [place] #3   Title  Pt. to tolerate standing for IADLs and work duties (pending MD clearance to return) at least 1 hour or greater without limitation due to knee pain    Status  On-going            Plan - 04/10/19 0829    Clinical Impression Statement  No pain post    tolerateing closed chain exercise without pain. talked to him about no running or jumping  for 12-16 weeks to allow time for surgery to heal properly.    PT Treatment/Interventions  ADLs/Self Care Home Management;Cryotherapy;Electrical Stimulation;Functional mobility training;Therapeutic activities;Therapeutic exercise;Gait training;Stair training;Balance training;Patient/family education;Manual techniques;Passive range of motion;Vasopneumatic Device;Taping;Neuromuscular re-education    PT Next Visit Plan  Continue closed chain exercises, continue per protocol    PT Home Exercise Plan  quad sets, heel slides, 4-way SLR, heel raises, partial squats    Consulted and Agree with Plan  of Care  Patient       Patient will benefit from skilled therapeutic intervention in order to improve the following deficits and impairments:  Pain, Decreased strength, Decreased range of motion, Increased edema, Decreased balance  Visit Diagnosis: Acute pain of right knee  Muscle weakness (generalized)     Problem List Patient Active Problem List   Diagnosis Date Noted  . Complete tear of right ACL, initial encounter 02/02/2019  . Bucket handle tear of lateral meniscus of right knee, initial encounter 02/02/2019    Darrel Hoover  PT 04/10/2019, 8:32 AM  [facility] [address] [place], [place], [address] Phone: [phone]   Fax:  [phone]  Name: Dennis Hayden MRN: [id number] Date of Birth: 1985-12-30

## 2019-04-12 ENCOUNTER — Other Ambulatory Visit: Payer: Self-pay

## 2019-04-12 ENCOUNTER — Encounter: Payer: Self-pay | Admitting: Physical Therapy

## 2019-04-12 ENCOUNTER — Ambulatory Visit: Payer: Self-pay | Attending: Orthopaedic Surgery | Admitting: Physical Therapy

## 2019-04-12 DIAGNOSIS — M6281 Muscle weakness (generalized): Secondary | ICD-10-CM | POA: Insufficient documentation

## 2019-04-12 DIAGNOSIS — M25561 Pain in right knee: Secondary | ICD-10-CM | POA: Insufficient documentation

## 2019-04-12 NOTE — Therapy (Signed)
[place], [place], [address] Phone: [phone]   Fax:  [phone]  Physical Therapy Treatment  Patient Details  Name: Dennis Hayden MRN: [id number] Date of Birth: Dec 02, 1985 Referring Provider (PT): Frankey Shown, MD   Encounter Date: 04/12/2019  PT End of Session - 04/12/19 1136    Visit Number  5    Number of Visits  16    Date for PT Re-Evaluation  05/25/19    Authorization Type  self-pay    PT Start Time  1100    PT Stop Time  1148    PT Time Calculation (min)  48 min    Activity Tolerance  Patient tolerated treatment well    Behavior During Therapy  [facility] for tasks assessed/performed       Past Medical History:  Diagnosis Date  . No pertinent past medical history     Past Surgical History:  Procedure Laterality Date  . ANTERIOR CRUCIATE LIGAMENT REPAIR    . KNEE ARTHROSCOPY WITH ANTERIOR CRUCIATE LIGAMENT (ACL) REPAIR Right 02/02/2019   Procedure: RIGHT KNEE ARTHROSCOPY, LATERAL MENISCUS REPAIR, ANTERIOR CRUCIATE LIGAMENT RECONSTRUCTION ALLOGRAPH;  Surgeon: Leandrew Koyanagi, MD;  Location: [place];  Service: Orthopedics;  Laterality: Right;    There were no vitals filed for this visit.  Subjective Assessment - 04/12/19 1100    Subjective  No pain pre-tx. Discussed running and advised typically wait until at least 12 weeks post-op to try straight line TM jog.    Pertinent History  L ACL surgery 9 years ago    Diagnostic tests  MRI, CT scan, X-rays    Patient Stated Goals  Return to work    Currently in Pain?  No/denies                       [facility] Adult PT Treatment/Exercise - 04/12/19 0001      Knee/Hip Exercises: Aerobic   Elliptical  6 min L5      Knee/Hip Exercises: Machines for Strengthening   Cybex Leg Press  3x12 with 80 lbs., partial ROM to 60 deg knee flexion, then performed 2x10 right single leg with 60 lbs.    Hip Cybex  3-way hip 37.5 lb. right  side 2x10 ea. direction      Knee/Hip Exercises: Standing   Heel Raises  Right;1 set;20 reps    Lateral Step Up  Right;2 sets;10 reps;Hand Hold: 0;Step Height: 8"    Forward Step Up  Right;2 sets;10 reps;Hand Hold: 0;Step Height: 8"    Step Down Limitations  eccentric lateral stepdown with right toe tap 2x10    Functional Squat Limitations  3x10 TRX partial squat    Rocker Board Limitations  2 min ea. fw/rev and lateral on wooden and blue boards respectively    SLS  R 5 x 10 sec on Airex    Other Standing Knee Exercises  partial split squat right 2x10, side partial split squat 2x10 right    Other Standing Knee Exercises  R SL hip hinge 2x10      Modalities   Modalities  Cryotherapy      Cryotherapy   Number Minutes Cryotherapy  5 Minutes   limited per pt. request   Cryotherapy Location  Knee    Type of Cryotherapy  Ice pack             PT Education - 04/12/19 1135    Education Details  progress, exercise form,  POC    Person(s) Educated  Patient    Methods  Explanation;Demonstration;Verbal cues    Comprehension  Verbalized understanding;Returned demonstration       PT Short Term Goals - 04/04/19 0813      PT SHORT TERM GOAL #1   Title  Independent with HEP    Status  On-going      PT SHORT TERM GOAL #2   Title  Right quad strength 4+/5 (avoid testing in end-range extension) for improved strength to assist return to work    Status  On-going        PT Long Term Goals - 04/04/19 0814      PT LONG TERM GOAL #1   Title  Improve FOTO outcome measure score to 12% or less impairment    Status  On-going      PT LONG TERM GOAL #2   Title  Right quad strength 5/5 to improve ability for lifting activities for work    Status  On-going      PT LONG TERM GOAL #3   Title  Pt. to tolerate standing for IADLs and work duties (pending MD clearance to return) at least 1 hour or greater without limitation due to knee pain    Status  On-going            Plan - 04/12/19  1136    Clinical Impression Statement  Dennis Hayden continues to progress well with therapy s/p right ACL surgery with meniscus repair. No major ROM limitations-progressing appropriately but still with some expected quad weakness for current timeframe post-op needing continued PT for further progress to assist return to work duties.    Examination-Activity Limitations  Lift;Squat;Stairs;Stand;Locomotion Level    Examination-Participation Restrictions  Community Activity;Other    Stability/Clinical Decision Making  Stable/Uncomplicated    Clinical Decision Making  Low    Rehab Potential  Excellent    PT Frequency  --   2-3x/week   PT Duration  8 weeks    PT Treatment/Interventions  ADLs/Self Care Home Management;Cryotherapy;Electrical Stimulation;Functional mobility training;Therapeutic activities;Therapeutic exercise;Gait training;Stair training;Balance training;Patient/family education;Manual techniques;Passive range of motion;Vasopneumatic Device;Taping;Neuromuscular re-education    PT Next Visit Plan  Continue closed chain exercises, continue per protocol    PT Home Exercise Plan  quad sets, heel slides, 4-way SLR, heel raises, partial squats    Consulted and Agree with Plan of Care  Patient       Patient will benefit from skilled therapeutic intervention in order to improve the following deficits and impairments:  Pain, Decreased strength, Decreased range of motion, Increased edema, Decreased balance  Visit Diagnosis: Acute pain of right knee  Muscle weakness (generalized)     Problem List Patient Active Problem List   Diagnosis Date Noted  . Complete tear of right ACL, initial encounter 02/02/2019  . Bucket handle tear of lateral meniscus of right knee, initial encounter 02/02/2019    Dennis Hayden, PT, DPT 04/12/19 11:45 AM  [facility] [address] [place], [place], [address] Phone: [phone]   Fax:  [phone]  Name:  Dennis Hayden MRN: [id number] Date of Birth: 1985/12/25

## 2019-04-19 ENCOUNTER — Encounter: Payer: Self-pay | Admitting: Physical Therapy

## 2019-04-19 ENCOUNTER — Other Ambulatory Visit: Payer: Self-pay

## 2019-04-19 ENCOUNTER — Ambulatory Visit: Payer: Self-pay | Admitting: Physical Therapy

## 2019-04-19 DIAGNOSIS — M6281 Muscle weakness (generalized): Secondary | ICD-10-CM

## 2019-04-19 DIAGNOSIS — M25561 Pain in right knee: Secondary | ICD-10-CM

## 2019-04-19 NOTE — Therapy (Signed)
[place], [place], [address] Phone: [phone]   Fax:  [phone]  Physical Therapy Treatment  Patient Details  Name: Dennis Hayden MRN: [id number] Date of Birth: Apr 05, 1986 Referring Provider (PT): Frankey Shown, MD   Encounter Date: 04/19/2019  PT End of Session - 04/19/19 0937    Visit Number  6    Number of Visits  16    Date for PT Re-Evaluation  05/25/19    Authorization Type  self-pay    PT Start Time  0934    PT Stop Time  1013    PT Time Calculation (min)  39 min    Activity Tolerance  Patient tolerated treatment well    Behavior During Therapy  [facility] for tasks assessed/performed       Past Medical History:  Diagnosis Date  . No pertinent past medical history     Past Surgical History:  Procedure Laterality Date  . ANTERIOR CRUCIATE LIGAMENT REPAIR    . KNEE ARTHROSCOPY WITH ANTERIOR CRUCIATE LIGAMENT (ACL) REPAIR Right 02/02/2019   Procedure: RIGHT KNEE ARTHROSCOPY, LATERAL MENISCUS REPAIR, ANTERIOR CRUCIATE LIGAMENT RECONSTRUCTION ALLOGRAPH;  Surgeon: Leandrew Koyanagi, MD;  Location: [place];  Service: Orthopedics;  Laterality: Right;    There were no vitals filed for this visit.  Subjective Assessment - 04/19/19 0934    Subjective  No knee pain this AM. No new complaints/concerns this AM.    Currently in Pain?  No/denies         [facility] PT Assessment - 04/19/19 0001      AROM   Right Knee Extension  0    Right Knee Flexion  130                   [facility] Adult PT Treatment/Exercise - 04/19/19 0001      Knee/Hip Exercises: Aerobic   Recumbent Bike  L3 x 6 min   elliptical in use/unavailable     Knee/Hip Exercises: Machines for Strengthening   Cybex Leg Press  3x12 100 lbs. bilat. LE, , 2x10 60 lbs. RLE only    Hip Cybex  3-way hip 37.5 lb. right side 2x10 ea. direction      Knee/Hip Exercises: Standing   Lateral Step Up  Right;2 sets;10 reps;Hand  Hold: 0;Step Height: 8"    Forward Step Up  Right;2 sets;10 reps;Hand Hold: 0;Step Height: 8"    Step Down Limitations  eccentric lateral stepdown with left toe tap 2x10    Functional Squat Limitations  3x10 partial front with 25 lb. Kettlebell    Rebounder  --   30 reps R SLS with left toe touch, Airex with 1000 g ball   Walking with Sports Cord  Freemotion 4-way walk 23 lbs. x 5 ea. direction    Other Standing Knee Exercises  TRX partial reverse lunges and side lunges 2x10 ea.      Knee/Hip Exercises: Supine   Bridges Limitations  hip bridge with legs on 55 cm ball 2x10 (calves on ball)             PT Education - 04/19/19 4235    Education Details  HEP, POC-schedule MD follow up soon re: work status per Dr. Erlinda Hong recommendation for 6 week follow up at last visit with him 03/14/19    Person(s) Educated  Patient    Methods  Explanation;Demonstration;Verbal cues    Comprehension  Returned demonstration;Verbalized understanding       PT Short  Term Goals - 04/04/19 0813      PT SHORT TERM GOAL #1   Title  Independent with HEP    Status  On-going      PT SHORT TERM GOAL #2   Title  Right quad strength 4+/5 (avoid testing in end-range extension) for improved strength to assist return to work    Status  On-going        PT Long Term Goals - 04/04/19 0814      PT LONG TERM GOAL #1   Title  Improve FOTO outcome measure score to 12% or less impairment    Status  On-going      PT LONG TERM GOAL #2   Title  Right quad strength 5/5 to improve ability for lifting activities for work    Status  On-going      PT LONG TERM GOAL #3   Title  Pt. to tolerate standing for IADLs and work duties (pending MD clearance to return) at least 1 hour or greater without limitation due to knee pain    Status  On-going            Plan - 04/19/19 0937    Clinical Impression Statement  Pt. doing well with ROM as previously. Primary deficit remains weakness but progressing appropriately for  timeframe post-op.    Examination-Activity Limitations  Lift;Squat;Stairs;Stand;Locomotion Level    Examination-Participation Restrictions  Community Activity;Other    Stability/Clinical Decision Making  Stable/Uncomplicated    Clinical Decision Making  Low    Rehab Potential  Excellent    PT Frequency  --   2-3x/week   PT Duration  8 weeks    PT Treatment/Interventions  ADLs/Self Care Home Management;Cryotherapy;Electrical Stimulation;Functional mobility training;Therapeutic activities;Therapeutic exercise;Gait training;Stair training;Balance training;Patient/family education;Manual techniques;Passive range of motion;Vasopneumatic Device;Taping;Neuromuscular re-education    PT Next Visit Plan  Continue closed chain exercises, continue per protocol    PT Home Exercise Plan  quad sets, heel slides, 4-way SLR, heel raises, partial squats, step ups    Consulted and Agree with Plan of Care  Patient       Patient will benefit from skilled therapeutic intervention in order to improve the following deficits and impairments:  Pain, Decreased strength, Decreased range of motion, Increased edema, Decreased balance  Visit Diagnosis: Acute pain of right knee  Muscle weakness (generalized)     Problem List Patient Active Problem List   Diagnosis Date Noted  . Complete tear of right ACL, initial encounter 02/02/2019  . Bucket handle tear of lateral meniscus of right knee, initial encounter 02/02/2019    Lazarus Gowdahristopher , PT, DPT 04/19/19 10:11 AM  [facility] [address] [place], [place], [address] Phone: [phone]   Fax:  [phone]  Name: Dennis Hayden MRN: [id number] Date of Birth: 12-Sep-1985

## 2019-04-21 ENCOUNTER — Encounter: Payer: Self-pay | Admitting: Physical Therapy

## 2019-04-21 ENCOUNTER — Ambulatory Visit: Payer: Self-pay | Admitting: Physical Therapy

## 2019-04-21 ENCOUNTER — Other Ambulatory Visit: Payer: Self-pay

## 2019-04-21 DIAGNOSIS — M25561 Pain in right knee: Secondary | ICD-10-CM

## 2019-04-21 DIAGNOSIS — M6281 Muscle weakness (generalized): Secondary | ICD-10-CM

## 2019-04-21 NOTE — Therapy (Signed)
[facility] [address] [place], [place], [address] Phone: [phone]   Fax:  [phone]  Physical Therapy Treatment  Patient Details  Name: Dennis CrutchJames Quant MRN: [id number] Date of Birth: 1986/07/08 Referring Provider (PT): Gershon MusselNaiping Xu, MD   Encounter Date: 04/21/2019  PT End of Session - 04/21/19 0849    Visit Number  7    Number of Visits  16    Date for PT Re-Evaluation  05/25/19    Authorization Type  self-pay    PT Start Time  0846   pt late   PT Stop Time  0915    PT Time Calculation (min)  29 min    Activity Tolerance  Patient tolerated treatment well    Behavior During Therapy  [facility] for tasks assessed/performed       Past Medical History:  Diagnosis Date  . No pertinent past medical history     Past Surgical History:  Procedure Laterality Date  . ANTERIOR CRUCIATE LIGAMENT REPAIR    . KNEE ARTHROSCOPY WITH ANTERIOR CRUCIATE LIGAMENT (ACL) REPAIR Right 02/02/2019   Procedure: RIGHT KNEE ARTHROSCOPY, LATERAL MENISCUS REPAIR, ANTERIOR CRUCIATE LIGAMENT RECONSTRUCTION ALLOGRAPH;  Surgeon: Tarry KosXu, Naiping M, MD;  Location: [facility];  Service: Orthopedics;  Laterality: Right;    There were no vitals filed for this visit.  Subjective Assessment - 04/21/19 0849    Subjective  No pain          [facility] Adult PT Treatment/Exercise - 04/21/19 0001      Knee/Hip Exercises: Aerobic   Elliptical  5 min L1 ramp 10       Knee/Hip Exercises: Standing   Side Lunges  Both;1 set;15 reps    Side Lunges Limitations  15 lbs     Rebounder  facing FW and then lateral each direction x 30 each , using yellow ball     Walking with Sports Cord  lateral band walks green 4 x 15 feet     Other Standing Knee Exercises  TRX: squats, curtsy lunge, single leg squat     Other Standing Knee Exercises  TRX hip hinge , alternating lunge on BOSU and BOSU squats, all x 10-15 reps          PT Short Term Goals - 04/04/19 0813      PT SHORT TERM GOAL #1   Title  Independent with HEP    Status  On-going      PT SHORT TERM GOAL #2   Title  Right quad strength 4+/5 (avoid testing in end-range extension) for improved strength to assist return to work    Status  On-going        PT Long Term Goals - 04/04/19 0814      PT LONG TERM GOAL #1   Title  Improve FOTO outcome measure score to 12% or less impairment    Status  On-going      PT LONG TERM GOAL #2   Title  Right quad strength 5/5 to improve ability for lifting activities for work    Status  On-going      PT LONG TERM GOAL #3   Title  Pt. to tolerate standing for IADLs and work duties (pending MD clearance to return) at least 1 hour or greater without limitation due to knee pain    Status  On-going            Plan - 04/21/19 0913    Clinical Impression Statement  SHowed  good stability today with TRX and BOSU workout.  Still needs cues for spinal length with deep squatting. Progressing well, eager to get back to work.    PT Treatment/Interventions  ADLs/Self Care Home Management;Cryotherapy;Electrical Stimulation;Functional mobility training;Therapeutic activities;Therapeutic exercise;Gait training;Stair training;Balance training;Patient/family education;Manual techniques;Passive range of motion;Vasopneumatic Device;Taping;Neuromuscular re-education    PT Next Visit Plan  Continue closed chain exercises, continue per protocol    PT Home Exercise Plan  quad sets, heel slides, 4-way SLR, heel raises, partial squats, step ups    Consulted and Agree with Plan of Care  Patient       Patient will benefit from skilled therapeutic intervention in order to improve the following deficits and impairments:  Pain, Decreased strength, Decreased range of motion, Increased edema, Decreased balance  Visit Diagnosis: Acute pain of right knee  Muscle weakness (generalized)     Problem List Patient Active Problem List   Diagnosis Date Noted  . Complete tear of  right ACL, initial encounter 02/02/2019  . Bucket handle tear of lateral meniscus of right knee, initial encounter 02/02/2019    Dennis Hayden 04/21/2019, [place], [place], [address] Phone: [phone]   Fax:  [phone]  Name: Dennis Hayden MRN: [id number] Date of Birth: [birth year]  Dennis Hayden, PT 04/21/19 9:15 AM Phone: [phone] Fax: [phone]

## 2019-04-26 ENCOUNTER — Encounter: Payer: Self-pay | Admitting: Physical Therapy

## 2019-04-26 ENCOUNTER — Other Ambulatory Visit: Payer: Self-pay

## 2019-04-26 ENCOUNTER — Ambulatory Visit: Payer: Self-pay | Admitting: Physical Therapy

## 2019-04-26 DIAGNOSIS — M6281 Muscle weakness (generalized): Secondary | ICD-10-CM

## 2019-04-26 DIAGNOSIS — M25561 Pain in right knee: Secondary | ICD-10-CM

## 2019-04-26 NOTE — Therapy (Signed)
[place], [place], [address] Phone: [phone]   Fax:  [phone]  Physical Therapy Treatment  Patient Details  Name: Dennis Hayden MRN: [id number] Date of Birth: [birth year] Referring Provider (PT): Frankey Shown, MD   Encounter Date: 04/26/2019  PT End of Session - 04/26/19 0945    Visit Number  8    Number of Visits  16    Date for PT Re-Evaluation  05/25/19    Authorization Type  self-pay    PT Start Time  0936    PT Stop Time  1014    PT Time Calculation (min)  38 min    Activity Tolerance  Patient tolerated treatment well    Behavior During Therapy  [facility] for tasks assessed/performed       Past Medical History:  Diagnosis Date  . No pertinent past medical history     Past Surgical History:  Procedure Laterality Date  . ANTERIOR CRUCIATE LIGAMENT REPAIR    . KNEE ARTHROSCOPY WITH ANTERIOR CRUCIATE LIGAMENT (ACL) REPAIR Right 02/02/2019   Procedure: RIGHT KNEE ARTHROSCOPY, LATERAL MENISCUS REPAIR, ANTERIOR CRUCIATE LIGAMENT RECONSTRUCTION ALLOGRAPH;  Surgeon: Leandrew Koyanagi, MD;  Location: [facility];  Service: Orthopedics;  Laterality: Right;    There were no vitals filed for this visit.  Subjective Assessment - 04/26/19 0941    Subjective  "Feels like it's getting stronger every day.". Pt. sees Dr. Erlinda Hong for follow up re: work status 05/03/19.    Currently in Pain?  No/denies         [facility] PT Assessment - 04/26/19 0001      Strength   Right Knee Flexion  5/5    Right Knee Extension  4+/5   tested at 60 deg flexion                  [facility] Adult PT Treatment/Exercise - 04/26/19 0001      Knee/Hip Exercises: Aerobic   Elliptical  5 min L1 ramp 10       Knee/Hip Exercises: Machines for Strengthening   Cybex Leg Press  3x12 120 lbs. bilat. LE    Hip Cybex  3-way hip 37.5 lb. right side 2x10 ea. direction      Knee/Hip Exercises: Standing   Rocker Board  2  minutes    Rocker Board Limitations  fw/rev on wooden board    Rebounder  30 throws R SLS on Airex with L toe touch 3000 g ball    Other Standing Knee Exercises  BOSU forward lunges and step ups 2x10 ea., reverse BOSU squat 2x10, TRX reverse lunge 2x10, TRX side lunge 2x10, TRX partial pistol squat 2x10    Other Standing Knee Exercises  hip hinge 2x10      Knee/Hip Exercises: Supine   Bridges Limitations  hip bridge with legs on 55 cm ball 2x10 (calves on ball)    Straight Leg Raises  Strengthening;Right;2 sets;10 reps    Straight Leg Raises Limitations  2 lbs.             PT Education - 04/26/19 0956    Education Details  POC    Person(s) Educated  Patient    Methods  Explanation    Comprehension  Verbalized understanding       PT Short Term Goals - 04/04/19 0813      PT SHORT TERM GOAL #1   Title  Independent with HEP    Status  On-going  PT SHORT TERM GOAL #2   Title  Right quad strength 4+/5 (avoid testing in end-range extension) for improved strength to assist return to work    Status  On-going        PT Long Term Goals - 04/26/19 1008      PT LONG TERM GOAL #1   Title  Improve FOTO outcome measure score to 12% or less impairment    Baseline  31%    Time  8    Period  Weeks    Status  On-going      PT LONG TERM GOAL #2   Title  Right quad strength 5/5 to improve ability for lifting activities for work    Baseline  4+/5    Time  8    Period  Weeks    Status  On-going      PT LONG TERM GOAL #3   Title  Pt. to tolerate standing for IADLs and work duties (pending MD clearance to return) at least 1 hour or greater without limitation due to knee pain    Baseline  able to tolerate >1 hour but pending MD clearance for return to work    Time  8    Period  Weeks    Status  On-going            Plan - 04/26/19 0948    Clinical Impression Statement  Dennis Hayden continues to progress well with therapy for timeframe post-op with right quad strength gains.  Still lacks some eccentric control and cues needed for squat mechanics. Pt. would benefit from continued PT for further progress to assist return to work.    Examination-Activity Limitations  Lift;Squat;Stairs;Stand;Locomotion Level    Examination-Participation Restrictions  Community Activity;Other    Stability/Clinical Decision Making  Stable/Uncomplicated    Clinical Decision Making  Low    Rehab Potential  Excellent    PT Frequency  --   2-3x/week   PT Duration  8 weeks    PT Treatment/Interventions  ADLs/Self Care Home Management;Cryotherapy;Electrical Stimulation;Functional mobility training;Therapeutic activities;Therapeutic exercise;Gait training;Stair training;Balance training;Patient/family education;Manual techniques;Passive range of motion;Vasopneumatic Device;Taping;Neuromuscular re-education    PT Next Visit Plan  Continue closed chain exercises, continue per protocol, trial TM jog per protocol weeks 13-14 post-op pending status (13 weeks 05/04/19)    PT Home Exercise Plan  quad sets, heel slides, 4-way SLR, heel raises, partial squats, step ups    Consulted and Agree with Plan of Care  Patient       Patient will benefit from skilled therapeutic intervention in order to improve the following deficits and impairments:  Pain, Decreased strength, Decreased range of motion, Increased edema, Decreased balance  Visit Diagnosis: Acute pain of right knee  Muscle weakness (generalized)     Problem List Patient Active Problem List   Diagnosis Date Noted  . Complete tear of right ACL, initial encounter 02/02/2019  . Bucket handle tear of lateral meniscus of right knee, initial encounter 02/02/2019     Dennis Hayden, PT, DPT 04/26/19 10:14 AM  [facility] [address] [place], [place], [address] Phone: [phone]   Fax:  [phone]  Name: Dennis Hayden MRN: [id number] Date of Birth: July 02, 1986

## 2019-04-28 ENCOUNTER — Other Ambulatory Visit: Payer: Self-pay

## 2019-04-28 ENCOUNTER — Ambulatory Visit: Payer: Self-pay | Admitting: Physical Therapy

## 2019-04-28 DIAGNOSIS — M6281 Muscle weakness (generalized): Secondary | ICD-10-CM

## 2019-04-28 DIAGNOSIS — M25561 Pain in right knee: Secondary | ICD-10-CM

## 2019-04-28 NOTE — Therapy (Signed)
[place], [place], [address] Phone: [phone]   Fax:  [phone]  Physical Therapy Treatment  Patient Details  Name: Dennis Hayden MRN: [id number] Date of Birth: 1986/07/12 Referring Provider (PT): Frankey Shown, MD   Encounter Date: 04/28/2019  PT End of Session - 04/28/19 1000    Visit Number  9    Number of Visits  16    Date for PT Re-Evaluation  05/25/19    PT Start Time  0916    PT Stop Time  1002    PT Time Calculation (min)  46 min    Activity Tolerance  Patient tolerated treatment well    Behavior During Therapy  [facility] for tasks assessed/performed       Past Medical History:  Diagnosis Date  . No pertinent past medical history     Past Surgical History:  Procedure Laterality Date  . ANTERIOR CRUCIATE LIGAMENT REPAIR    . KNEE ARTHROSCOPY WITH ANTERIOR CRUCIATE LIGAMENT (ACL) REPAIR Right 02/02/2019   Procedure: RIGHT KNEE ARTHROSCOPY, LATERAL MENISCUS REPAIR, ANTERIOR CRUCIATE LIGAMENT RECONSTRUCTION ALLOGRAPH;  Surgeon: Leandrew Koyanagi, MD;  Location: [facility];  Service: Orthopedics;  Laterality: Right;    There were no vitals filed for this visit.  Subjective Assessment - 04/28/19 0920    Subjective  no pain.  Feeling better.    Currently in Pain?  No/denies           [facility] Adult PT Treatment/Exercise - 04/28/19 0001      Knee/Hip Exercises: Stretches   Active Hamstring Stretch  Right;3 reps;30 seconds    Quad Stretch  Right;2 reps;30 seconds    Hip Flexor Stretch  Right;2 reps;30 seconds    Hip Flexor Stretch Limitations  added knee flexion used chair     Other Knee/Hip Stretches  groin stretching, legs wide in standing added lateral lunge    no UE x 10      Knee/Hip Exercises: Aerobic   Tread Mill  4% grade, 3 mph for 8 min       Knee/Hip Exercises: Standing   Side Lunges  Both;1 set;15 reps    Forward Step Up  Right;Left;2 sets;15 reps;Hand Hold: 0;Step  Height: 6"    Forward Step Up Limitations  freemotion 6 plates     Step Down  Right;1 set;15 reps;Hand Hold: 0;Step Height: 6";Step Height: 8"    Step Down Limitations  control     Functional Squat  20 reps    Functional Squat Limitations  25 lbs     Other Standing Knee Exercises  dead lift 25 lbs x 20 mod cueing     Other Standing Knee Exercises  weighted walking 6 plates facing forward and sideways x6        Knee/Hip Exercises: Sidelying   Hip ABduction  Strengthening;Both;1 set;20 reps    Hip ADduction  Strengthening;Both;1 set;20 reps             PT Education - 04/28/19 0959    Education Details  form with lifting, squatting    Person(s) Educated  Patient    Methods  Explanation    Comprehension  Verbalized understanding;Returned demonstration       PT Short Term Goals - 04/28/19 1000      PT SHORT TERM GOAL #1   Title  Independent with HEP    Status  Achieved      PT SHORT TERM GOAL #2   Title  Right  quad strength 4+/5 (avoid testing in end-range extension) for improved strength to assist return to work    Baseline  4/5 mid range end of session        PT Long Term Goals - 04/28/19 1001      PT LONG TERM GOAL #1   Title  Improve FOTO outcome measure score to 12% or less impairment    Baseline  31%    Status  On-going      PT LONG TERM GOAL #2   Title  Right quad strength 5/5 to improve ability for lifting activities for work    Baseline  4/5 mid range, near end range 5/5    Status  On-going      PT LONG TERM GOAL #3   Title  Pt. to tolerate standing for IADLs and work duties (pending MD clearance to return) at least 1 hour or greater without limitation due to knee pain    Status  On-going            Plan - 04/28/19 0916    Clinical Impression Statement  Pt continues to show good overall strength and capacity for normal ADLs.  Needs mod cues for lifting safely, uses back. Quad strength at mid range is 4/5, decreased muscle endurance.  No appts  available next week will see what MD says.    PT Treatment/Interventions  ADLs/Self Care Home Management;Cryotherapy;Electrical Stimulation;Functional mobility training;Therapeutic activities;Therapeutic exercise;Gait training;Stair training;Balance training;Patient/family education;Manual techniques;Passive range of motion;Vasopneumatic Device;Taping;Neuromuscular re-education    PT Next Visit Plan  Continue closed chain exercises, continue per protocol, trial TM jog per protocol weeks 13-14 post-op pending status (13 weeks 05/04/19)    PT Home Exercise Plan  quad sets, heel slides, 4-way SLR, heel raises, partial squats, step ups    Consulted and Agree with Plan of Care  Patient       Patient will benefit from skilled therapeutic intervention in order to improve the following deficits and impairments:  Pain, Decreased strength, Decreased range of motion, Increased edema, Decreased balance  Visit Diagnosis: Acute pain of right knee  Muscle weakness (generalized)     Problem List Patient Active Problem List   Diagnosis Date Noted  . Complete tear of right ACL, initial encounter 02/02/2019  . Bucket handle tear of lateral meniscus of right knee, initial encounter 02/02/2019    PAA,JENNIFER 04/28/2019, 10:23 AM  [facility] [address] [place], [place], [address] Phone: [phone]   Fax:  [phone]  Name: Melinda CrutchJames Adorno MRN: [id number] Date of Birth: 02/01/1986  Karie MainlandJennifer Paa, PT 04/28/19 10:23 AM Phone: [phone] Fax: [phone]

## 2019-05-03 ENCOUNTER — Ambulatory Visit (INDEPENDENT_AMBULATORY_CARE_PROVIDER_SITE_OTHER): Payer: Self-pay | Admitting: Orthopaedic Surgery

## 2019-05-03 ENCOUNTER — Encounter: Payer: Self-pay | Admitting: Orthopaedic Surgery

## 2019-05-03 DIAGNOSIS — S83251A Bucket-handle tear of lateral meniscus, current injury, right knee, initial encounter: Secondary | ICD-10-CM

## 2019-05-03 DIAGNOSIS — S83511A Sprain of anterior cruciate ligament of right knee, initial encounter: Secondary | ICD-10-CM

## 2019-05-03 NOTE — Progress Notes (Signed)
   Post-Op Visit Note   Patient: Dennis Hayden           Date of Birth: 30-May-1986           MRN: [id number] Visit Date: 05/03/2019 PCP: Patient, No Pcp Per   Assessment & Plan:  Chief Complaint:  Chief Complaint  Patient presents with  . Right Knee - Follow-up   Visit Diagnoses:  1. Bucket handle tear of lateral meniscus of right knee, initial encounter   2. Complete tear of right ACL, initial encounter     Plan: Lydia is 3 months status post right ACL reconstruction lateral meniscus repair.  He is overall doing well reports no problems.  He has been progressing with physical therapy well.  He denies any pain.  He recently had to run while he was at a nightclub because he started shooting he said that the right knee held up just fine.  He is about halfway through his PT sessions.  Denies any swelling.  Surgical scars are fully healed.  He has full range of motion.  ACL feels stable.  At this point he should continue to work on general strengthening and endurance.  He may increase his activity that only involves straight line movement.  I would like to recheck him in 3 months.  After reviewing his job requirements I think it is fine to allow him to return back to work starting tomorrow.  Questions encouraged and answered.  Follow-Up Instructions: Return in about 3 months (around 08/02/2019).   Orders:  No orders of the defined types were placed in this encounter.  No orders of the defined types were placed in this encounter.   Imaging: No results found.  PMFS History: Patient Active Problem List   Diagnosis Date Noted  . Complete tear of right ACL, initial encounter 02/02/2019  . Bucket handle tear of lateral meniscus of right knee, initial encounter 02/02/2019   Past Medical History:  Diagnosis Date  . No pertinent past medical history     History reviewed. No pertinent family history.  Past Surgical History:  Procedure Laterality Date  . ANTERIOR CRUCIATE LIGAMENT  REPAIR    . KNEE ARTHROSCOPY WITH ANTERIOR CRUCIATE LIGAMENT (ACL) REPAIR Right 02/02/2019   Procedure: RIGHT KNEE ARTHROSCOPY, LATERAL MENISCUS REPAIR, ANTERIOR CRUCIATE LIGAMENT RECONSTRUCTION ALLOGRAPH;  Surgeon: Leandrew Koyanagi, MD;  Location: [facility];  Service: Orthopedics;  Laterality: Right;   Social History   Occupational History  . Not on file  Tobacco Use  . Smoking status: Current Every Day Smoker    Packs/day: 1.00    Types: Cigars, Cigarettes  . Smokeless tobacco: Never Used  Substance and Sexual Activity  . Alcohol use: Yes  . Drug use: Yes    Types: Marijuana    Comment: occas  . Sexual activity: Yes    Birth control/protection: None

## 2019-05-08 ENCOUNTER — Ambulatory Visit: Payer: Self-pay | Admitting: Physical Therapy

## 2019-05-08 ENCOUNTER — Telehealth: Payer: Self-pay | Admitting: Physical Therapy

## 2019-05-08 NOTE — Telephone Encounter (Signed)
Attempted to call regarding no show for appointment today. No answer/left voicemail with reminder of next appointment time.

## 2019-05-10 ENCOUNTER — Ambulatory Visit: Payer: Self-pay | Admitting: Physical Therapy

## 2019-05-10 ENCOUNTER — Telehealth: Payer: Self-pay | Admitting: Physical Therapy

## 2019-05-10 NOTE — Telephone Encounter (Signed)
Attempted to call patient regarding no show for 8:45 AM appointment-left voicemail.

## 2019-05-15 ENCOUNTER — Telehealth: Payer: Self-pay | Admitting: Physical Therapy

## 2019-05-15 ENCOUNTER — Ambulatory Visit: Payer: Self-pay | Admitting: Physical Therapy

## 2019-05-15 NOTE — Telephone Encounter (Signed)
Called and left voicemail regarding no show and informed further PT visits cancelled per attendance policy.

## 2019-05-17 ENCOUNTER — Ambulatory Visit: Payer: Self-pay | Admitting: Physical Therapy

## 2019-05-29 NOTE — Therapy (Signed)
[place], [place], [address] Phone: [phone]   Fax:  [phone]  Physical Therapy Treatment/Discharge  Patient Details  Name: Dennis Hayden MRN: [id number] Date of Birth: 11-29-1985 Referring Provider (PT): Frankey Shown, MD   Encounter Date: 04/28/2019    Past Medical History:  Diagnosis Date  . No pertinent past medical history     Past Surgical History:  Procedure Laterality Date  . ANTERIOR CRUCIATE LIGAMENT REPAIR    . KNEE ARTHROSCOPY WITH ANTERIOR CRUCIATE LIGAMENT (ACL) REPAIR Right 02/02/2019   Procedure: RIGHT KNEE ARTHROSCOPY, LATERAL MENISCUS REPAIR, ANTERIOR CRUCIATE LIGAMENT RECONSTRUCTION ALLOGRAPH;  Surgeon: Leandrew Koyanagi, MD;  Location: [facility];  Service: Orthopedics;  Laterality: Right;    There were no vitals filed for this visit.                              PT Short Term Goals - 04/28/19 1000      PT SHORT TERM GOAL #1   Title  Independent with HEP    Status  Achieved      PT SHORT TERM GOAL #2   Title  Right quad strength 4+/5 (avoid testing in end-range extension) for improved strength to assist return to work    Baseline  4/5 mid range end of session        PT Long Term Goals - 04/28/19 1001      PT LONG TERM GOAL #1   Title  Improve FOTO outcome measure score to 12% or less impairment    Baseline  31%    Status  On-going      PT LONG TERM GOAL #2   Title  Right quad strength 5/5 to improve ability for lifting activities for work    Baseline  4/5 mid range, near end range 5/5    Status  On-going      PT LONG TERM GOAL #3   Title  Pt. to tolerate standing for IADLs and work duties (pending MD clearance to return) at least 1 hour or greater without limitation due to knee pain    Status  On-going              Patient will benefit from skilled therapeutic intervention in order to improve the following deficits  and impairments:  Pain, Decreased strength, Decreased range of motion, Increased edema, Decreased balance  Visit Diagnosis: Acute pain of right knee  Muscle weakness (generalized)     Problem List Patient Active Problem List   Diagnosis Date Noted  . Complete tear of right ACL, initial encounter 02/02/2019  . Bucket handle tear of lateral meniscus of right knee, initial encounter 02/02/2019       PHYSICAL THERAPY DISCHARGE SUMMARY  Visits from Start of Care: 9  Current functional level related to goals / functional outcomes: Patient did not return for further therapy after last session 04/28/19 with "no show" for the last several scheduled visits and did not return calls to check on status. No further PT scheduled at this time-patient to follow up with MD if needing further future therapy.   Remaining deficits: Right quad weakness   Education / Equipment: NA Plan: Patient agrees to discharge.  Patient goals were partially met. Patient is being discharged due to not returning since the last visit.  ?????            Beaulah Dinning, PT, DPT 05/29/19 10:07  AM      [facility] [address] [place], [place], [address] Phone: [phone]   Fax:  [phone]  Name: Dennis Hayden MRN: [id number] Date of Birth: [birth year]

## 2019-06-10 ENCOUNTER — Other Ambulatory Visit: Payer: Self-pay

## 2019-06-10 ENCOUNTER — Encounter (HOSPITAL_COMMUNITY): Payer: Self-pay | Admitting: Emergency Medicine

## 2019-06-10 ENCOUNTER — Emergency Department (HOSPITAL_COMMUNITY)
Admission: EM | Admit: 2019-06-10 | Discharge: 2019-06-10 | Disposition: A | Discharge status: Home / Self Care | Payer: Self-pay | Attending: Emergency Medicine | Admitting: Emergency Medicine

## 2019-06-10 DIAGNOSIS — B309 Viral conjunctivitis, unspecified: Secondary | ICD-10-CM | POA: Insufficient documentation

## 2019-06-10 DIAGNOSIS — F1210 Cannabis abuse, uncomplicated: Secondary | ICD-10-CM | POA: Insufficient documentation

## 2019-06-10 DIAGNOSIS — F17210 Nicotine dependence, cigarettes, uncomplicated: Secondary | ICD-10-CM | POA: Insufficient documentation

## 2019-06-10 MED ORDER — CIPROFLOXACIN HCL 0.3 % OP SOLN
1.0000 [drp] | OPHTHALMIC | Status: DC
  Administered 2019-06-10: 1 [drp] via OPHTHALMIC
  Filled 2019-06-10: qty 2.5

## 2019-06-10 NOTE — ED Provider Notes (Signed)
[facility] EMERGENCY DEPARTMENT Provider Note   CSN: [id number] Arrival date & time: 06/10/19  0830     History   Chief Complaint Chief Complaint  Patient presents with  . Eye Drainage    HPI Dennis Hayden is a 33 y.o. male.     HPI  This patient is a 33 year old male, complains of bilateral eye pain redness itching and watering for the last couple of days.  It started out with more of an itching and now just feels irritated.  There is no changes in vision, no sensitivity to light, no pain with extraocular movements, no associated fever sneezing runny nose sore throat coughing shortness of breath or anybody has been sick around him.  Using over-the-counter eyedrops without relief.  Does not wear contact lenses or corrective lenses.  Past Medical History:  Diagnosis Date  . No pertinent past medical history     Patient Active Problem List   Diagnosis Date Noted  . Complete tear of right ACL, initial encounter 02/02/2019  . Bucket handle tear of lateral meniscus of right knee, initial encounter 02/02/2019    Past Surgical History:  Procedure Laterality Date  . ANTERIOR CRUCIATE LIGAMENT REPAIR    . KNEE ARTHROSCOPY WITH ANTERIOR CRUCIATE LIGAMENT (ACL) REPAIR Right 02/02/2019   Procedure: RIGHT KNEE ARTHROSCOPY, LATERAL MENISCUS REPAIR, ANTERIOR CRUCIATE LIGAMENT RECONSTRUCTION ALLOGRAPH;  Surgeon: Tarry Kos, MD;  Location: [facility];  Service: Orthopedics;  Laterality: Right;        Home Medications    Prior to Admission medications   Medication Sig Start Date End Date Taking? Authorizing Provider  promethazine (PHENERGAN) 25 MG tablet Take 1 tablet (25 mg total) by mouth every 6 (six) hours as needed for nausea. Patient not taking: Reported on 03/30/2019 02/02/19 06/10/19  Tarry Kos, MD    Family History No family history on file.  Social History Social History   Tobacco Use  . Smoking status: Current Every Day Smoker     Packs/day: 1.00    Types: Cigars, Cigarettes  . Smokeless tobacco: Never Used  Substance Use Topics  . Alcohol use: Yes  . Drug use: Yes    Types: Marijuana    Comment: occas     Allergies   Patient has no known allergies.   Review of Systems Review of Systems  HENT: Negative for rhinorrhea, sneezing and sore throat.   Eyes: Positive for discharge, redness and itching.  Respiratory: Negative for cough.      Physical Exam Updated Vital Signs BP 124/89 (BP Location: Right Arm)   Pulse 65   Temp 98.1 F (36.7 C) (Oral)   Resp 16   SpO2 98%   Physical Exam Constitutional:      General: He is not in acute distress.    Appearance: He is not ill-appearing.  HENT:     Head: Normocephalic and atraumatic.     Nose: Nose normal.     Mouth/Throat:     Mouth: Mucous membranes are moist.     Pharynx: Oropharynx is clear. No oropharyngeal exudate or posterior oropharyngeal erythema.  Eyes:     Extraocular Movements: Extraocular movements intact.     Pupils: Pupils are equal, round, and reactive to light.     Comments: Bilateral mild conjunctival injection bilaterally, there is clear watery drainage, no purulence, no periorbital swelling or edema  Neck:     Musculoskeletal: No neck rigidity.  Cardiovascular:     Rate and Rhythm: Normal  rate.     Heart sounds: Normal heart sounds. No murmur.  Pulmonary:     Effort: No respiratory distress.  Lymphadenopathy:     Cervical: No cervical adenopathy.      ED Treatments / Results  Labs (all labs ordered are listed, but only abnormal results are displayed) Labs Reviewed - No data to display  EKG None  Radiology No results found.  Procedures Procedures (including critical care time)  Medications Ordered in ED Medications  ciprofloxacin (CILOXAN) 0.3 % ophthalmic solution 1 drop (has no administration in time range)     Initial Impression / Assessment and Plan / ED Course  I have reviewed the triage vital signs  and the nursing notes.  Pertinent labs & imaging results that were available during my care of the patient were reviewed by me and considered in my medical decision making (see chart for details).        Well-appearing with bilateral conjunctivitis, stable for discharge, no signs of iritis, no signs of corneal abrasion clinically and historically it does not make sense.  The patient will be treated with drops, can follow-up with ophthalmology.  He is agreeable to the plan.  Final Clinical Impressions(s) / ED Diagnoses   Final diagnoses:  Acute viral conjunctivitis of both eyes   Given Cipro drops here, will take them at home   Noemi Chapel, MD 06/10/19 [phone]

## 2019-06-10 NOTE — ED Triage Notes (Signed)
C/o bilateral eye redness and drainage since Wednesday.  Denies itching.

## 2019-06-10 NOTE — Discharge Instructions (Signed)
Please use the Cipro drops, 1 drop in each eye every 4 hours for the next 5 days  Seek medical exam for severe or worsening symptoms

## 2019-06-12 ENCOUNTER — Emergency Department (HOSPITAL_COMMUNITY)
Admission: EM | Admit: 2019-06-12 | Discharge: 2019-06-12 | Disposition: A | Discharge status: Home / Self Care | Payer: Self-pay | Attending: Emergency Medicine | Admitting: Emergency Medicine

## 2019-06-12 ENCOUNTER — Other Ambulatory Visit: Payer: Self-pay

## 2019-06-12 DIAGNOSIS — F17210 Nicotine dependence, cigarettes, uncomplicated: Secondary | ICD-10-CM | POA: Insufficient documentation

## 2019-06-12 DIAGNOSIS — H1033 Unspecified acute conjunctivitis, bilateral: Secondary | ICD-10-CM | POA: Insufficient documentation

## 2019-06-12 DIAGNOSIS — Z0279 Encounter for issue of other medical certificate: Secondary | ICD-10-CM | POA: Insufficient documentation

## 2019-06-12 NOTE — ED Notes (Signed)
Patient Alert and oriented to baseline. Stable and ambulatory to baseline. Patient verbalized understanding of the discharge instructions.  Patient belongings were taken by the patient.   

## 2019-06-12 NOTE — ED Triage Notes (Signed)
Pt reports continued eye reddness, drainage, denies recent fever. States unable to return to work due to ongoing symptoms. Taking prescribed medications. VSS.

## 2019-06-12 NOTE — ED Provider Notes (Signed)
[facility] EMERGENCY DEPARTMENT Provider Note   CSN: [id number] Arrival date & time: 06/12/19  1137     History   Chief Complaint Chief Complaint  Patient presents with  . Conjunctivitis    HPI Dennis Hayden is a 33 y.o. male.     HPI   33 year old male presents today with complaints of conjunctivitis.  Patient notes a 5-day history of bilateral redness itching and discomfort to his eyes.  He was seen on 06/10/2019 and diagnosed with conjunctivitis, he was placed on antibiotic drops.  He notes he has been using the drops, they have been improving his symptoms.  He attempted go to work today and was told he may not return to work until symptoms have resolved.  Patient denies any difficulty seeing, significant pain fever or any other complications.  Again he notes symptoms are improving but is unable to return to work until symptoms have resolved and is requesting a work note.  Past Medical History:  Diagnosis Date  . No pertinent past medical history     Patient Active Problem List   Diagnosis Date Noted  . Complete tear of right ACL, initial encounter 02/02/2019  . Bucket handle tear of lateral meniscus of right knee, initial encounter 02/02/2019    Past Surgical History:  Procedure Laterality Date  . ANTERIOR CRUCIATE LIGAMENT REPAIR    . KNEE ARTHROSCOPY WITH ANTERIOR CRUCIATE LIGAMENT (ACL) REPAIR Right 02/02/2019   Procedure: RIGHT KNEE ARTHROSCOPY, LATERAL MENISCUS REPAIR, ANTERIOR CRUCIATE LIGAMENT RECONSTRUCTION ALLOGRAPH;  Surgeon: Leandrew Koyanagi, MD;  Location: [place] [facility];  Service: Orthopedics;  Laterality: Right;        Home Medications    Prior to Admission medications   Medication Sig Start Date End Date Taking? Authorizing Provider  promethazine (PHENERGAN) 25 MG tablet Take 1 tablet (25 mg total) by mouth every 6 (six) hours as needed for nausea. Patient not taking: Reported on 03/30/2019 02/02/19 06/10/19  Leandrew Koyanagi, MD     Family History No family history on file.  Social History Social History   Tobacco Use  . Smoking status: Current Every Day Smoker    Packs/day: 1.00    Types: Cigars, Cigarettes  . Smokeless tobacco: Never Used  Substance Use Topics  . Alcohol use: Yes  . Drug use: Yes    Types: Marijuana    Comment: occas     Allergies   Patient has no known allergies.   Review of Systems Review of Systems  All other systems reviewed and are negative.    Physical Exam Updated Vital Signs BP (!) 158/100   Pulse 82   Temp 98.4 F (36.9 C) (Oral)   Resp 16   SpO2 98%   Physical Exam Vitals signs and nursing note reviewed.  Constitutional:      Appearance: He is well-developed.  HENT:     Head: Normocephalic and atraumatic.  Eyes:     General: No scleral icterus.       Right eye: No discharge.        Left eye: No discharge.     Conjunctiva/sclera: Conjunctivae normal.     Pupils: Pupils are equal, round, and reactive to light.     Comments: Bilateral conjunctival injection with watery discharge, pupils equal round reactive light extraocular movements intact and pain-free, no surrounding erythema, vision equal and normal bilateral  Neck:     Musculoskeletal: Normal range of motion.     Vascular: No JVD.  Trachea: No tracheal deviation.  Pulmonary:     Effort: Pulmonary effort is normal.     Breath sounds: No stridor.  Neurological:     Mental Status: He is alert and oriented to person, place, and time.     Coordination: Coordination normal.  Psychiatric:        Behavior: Behavior normal.        Thought Content: Thought content normal.        Judgment: Judgment normal.     ED Treatments / Results  Labs (all labs ordered are listed, but only abnormal results are displayed) Labs Reviewed - No data to display  EKG None  Radiology No results found.  Procedures Procedures (including critical care time)  Medications Ordered in ED Medications - No data  to display   Initial Impression / Assessment and Plan / ED Course  I have reviewed the triage vital signs and the nursing notes.  Pertinent labs & imaging results that were available during my care of the patient were reviewed by me and considered in my medical decision making (see chart for details).        33 year old male presents today with conjunctivitis, he does have improvement in his symptoms and is requesting a work note.  He will follow-up as an outpatient if symptoms persist return if they worsen.  He verbalized understanding and agreement to today's plan had no further questions or concerns.  Final Clinical Impressions(s) / ED Diagnoses   Final diagnoses:  Acute bacterial conjunctivitis of both eyes    ED Discharge Orders    None       Eyvonne Mechanic, PA-C 06/13/19 1104    Derwood Kaplan, MD 06/19/19 [phone]

## 2019-06-12 NOTE — Discharge Instructions (Signed)
Please read attached information. If you experience any new or worsening signs or symptoms please return to the emergency room for evaluation. Please follow-up with your primary care provider or specialist as discussed. Please use medication prescribed only as directed and discontinue taking if you have any concerning signs or symptoms.   °

## 2019-09-06 ENCOUNTER — Emergency Department (HOSPITAL_COMMUNITY)
Admission: EM | Admit: 2019-09-06 | Discharge: 2019-09-06 | Disposition: A | Discharge status: Home / Self Care | Payer: Managed Care, Other (non HMO) | Attending: Emergency Medicine | Admitting: Emergency Medicine

## 2019-09-06 ENCOUNTER — Other Ambulatory Visit: Payer: Self-pay

## 2019-09-06 ENCOUNTER — Encounter (HOSPITAL_COMMUNITY): Payer: Self-pay

## 2019-09-06 DIAGNOSIS — Z20822 Contact with and (suspected) exposure to covid-19: Secondary | ICD-10-CM | POA: Diagnosis not present

## 2019-09-06 DIAGNOSIS — R197 Diarrhea, unspecified: Secondary | ICD-10-CM | POA: Insufficient documentation

## 2019-09-06 DIAGNOSIS — F17210 Nicotine dependence, cigarettes, uncomplicated: Secondary | ICD-10-CM | POA: Insufficient documentation

## 2019-09-06 NOTE — Discharge Instructions (Signed)
You were tested for coronavirus today.  Results should return in the next 1 to 2 days.  If positive, you receive a phone call.  If negative, you will not.  Either way, you may check online on MyChart. If testing is negative, you may return to work. If testing is positive, you will need to continue to quarantine until 10 days from symptom onset (2/3).  After that, you may end quarantine if you have been fever free for 24 hours and your symptoms are improving. Return to the emergency room with any new, worsening, concerning symptoms.

## 2019-09-06 NOTE — ED Triage Notes (Signed)
Patient c/o diarrhea x 3 days. Patient denies any N/V or abdominal pain.

## 2019-09-06 NOTE — ED Provider Notes (Addendum)
[facility] DEPT Provider Note   CSN: [id number] Arrival date & time: 09/06/19  0825     History Chief Complaint  Patient presents with  . Diarrhea    Dennis Hayden is a 34 y.o. male presenting for evaluation of diarrhea.  Patient states that the past 2 days, he has been having gradually improving diarrhea.  Initially, patient was having 10-20 bowel movements a day.  This has since improved, he had 1 bowel movement this morning which was slightly softer than normal, but otherwise at baseline.  He denies fevers, chills, chest pain, shortness of breath, cough, nausea, vomiting, domino pain, hematochezia, melena, or urinary symptoms.  He has no medical problems, takes medications daily.  He has not been taking anything for the diarrhea.  He denies sick contacts.  Patient states he is in the ER today because his job is making him get cleared for covid.  He denies contact with known COVID-19 positive person.  HPI     Past Medical History:  Diagnosis Date  . No pertinent past medical history     Patient Active Problem List   Diagnosis Date Noted  . Complete tear of right ACL, initial encounter 02/02/2019  . Bucket handle tear of lateral meniscus of right knee, initial encounter 02/02/2019    Past Surgical History:  Procedure Laterality Date  . ANTERIOR CRUCIATE LIGAMENT REPAIR    . KNEE ARTHROSCOPY WITH ANTERIOR CRUCIATE LIGAMENT (ACL) REPAIR Right 02/02/2019   Procedure: RIGHT KNEE ARTHROSCOPY, LATERAL MENISCUS REPAIR, ANTERIOR CRUCIATE LIGAMENT RECONSTRUCTION ALLOGRAPH;  Surgeon: Leandrew Koyanagi, MD;  Location: [facility];  Service: Orthopedics;  Laterality: Right;       Family History  Problem Relation Age of Onset  . Hypertension Mother     Social History   Tobacco Use  . Smoking status: Current Every Day Smoker    Packs/day: 1.00    Types: Cigars, Cigarettes  . Smokeless tobacco: Never Used  Substance Use Topics  . Alcohol  use: Yes  . Drug use: Yes    Types: Marijuana    Comment: occas    Home Medications Prior to Admission medications   Medication Sig Start Date End Date Taking? Authorizing Provider  promethazine (PHENERGAN) 25 MG tablet Take 1 tablet (25 mg total) by mouth every 6 (six) hours as needed for nausea. Patient not taking: Reported on 03/30/2019 02/02/19 06/10/19  Leandrew Koyanagi, MD    Allergies    Patient has no known allergies.  Review of Systems   Review of Systems  Gastrointestinal: Positive for diarrhea (resolving).    Physical Exam Updated Vital Signs BP (!) 178/110   Pulse 71   Temp 98.3 F (36.8 C) (Oral)   Resp 16   Ht 5\' 11"  (1.803 m)   Wt 99.8 kg   SpO2 97%   BMI 30.68 kg/m   Physical Exam Vitals and nursing note reviewed.  Constitutional:      General: He is not in acute distress.    Appearance: Normal appearance. He is well-developed.     Comments: Sitting comfortably in the bed in no acute distress  HENT:     Head: Normocephalic and atraumatic.     Mouth/Throat:     Mouth: Mucous membranes are moist.  Cardiovascular:     Rate and Rhythm: Normal rate and regular rhythm.     Pulses: Normal pulses.  Pulmonary:     Effort: Pulmonary effort is normal.     Breath sounds:  Normal breath sounds.     Comments: Speaking in full sentences.  Clear lung sounds in all fields. Abdominal:     General: There is no distension.     Palpations: There is no mass.     Tenderness: There is no abdominal tenderness. There is no guarding or rebound.     Comments: No tenderness palpation the abdomen.  Soft without rigidity, guarding, distention.  Negative rebound.  Musculoskeletal:        General: Normal range of motion.     Cervical back: Normal range of motion.  Skin:    General: Skin is warm.     Findings: No rash.  Neurological:     Mental Status: He is alert and oriented to person, place, and time.       ED Results / Procedures / Treatments   Labs (all labs  ordered are listed, but only abnormal results are displayed) Labs Reviewed  NOVEL CORONAVIRUS, NAA (HOSP ORDER, SEND-OUT TO REF LAB; TAT 18-24 HRS)    EKG None  Radiology No results found.  Procedures Procedures (including critical care time)  Medications Ordered in ED Medications - No data to display  ED Course  I have reviewed the triage vital signs and the nursing notes.  Pertinent labs & imaging results that were available during my care of the patient were reviewed by me and considered in my medical decision making (see chart for details).    MDM Rules/Calculators/A&P                      Patient presenting for evaluation of diarrhea.  Symptoms have almost completely resolved at this point.  He is here to be cleared to go back to work.  No other symptoms including fevers, cough, shortness of breath. Low suspicion for covid. Will perform send out test and allow pt to return to work if negative.  As his symptoms are improving, I do not believe he needs treatment for his diarrhea.  As his vitals are stable and symptoms are improving, I do not believe he needs further emergent work-up such as labs or imaging.  Discussed with patient, who is agreeable.  At this time, patient appears safe for discharge.  Return precautions given.  Patient states he understands and agrees to plan.  Dennis Hayden was evaluated in Emergency Department on 09/06/2019 for the symptoms described in the history of present illness. He was evaluated in the context of the global COVID-19 pandemic, which necessitated consideration that the patient might be at risk for infection with the SARS-CoV-2 virus that causes COVID-19. Institutional protocols and algorithms that pertain to the evaluation of patients at risk for COVID-19 are in a state of rapid change based on information released by regulatory bodies including the CDC and federal and state organizations. These policies and algorithms were followed during the  patient's care in the ED.  Final Clinical Impression(s) / ED Diagnoses Final diagnoses:  Diarrhea, unspecified type    Rx / DC Orders ED Discharge Orders    None       Alveria Apley, PA-C 09/06/19 [id number]    Jacalyn Lefevre, MD 09/06/19 1230    [address], Dennis Brennen, PA-C 09/20/19 0737    Jacalyn Lefevre, MD 09/20/19 1059

## 2019-09-07 LAB — NOVEL CORONAVIRUS, NAA (HOSP ORDER, SEND-OUT TO REF LAB; TAT 18-24 HRS): SARS-CoV-2, NAA: NOT DETECTED

## 2019-09-12 ENCOUNTER — Other Ambulatory Visit: Payer: Self-pay | Admitting: Orthopaedic Surgery

## 2019-09-12 NOTE — Telephone Encounter (Signed)
Does he need this or is it from pharmacy

## 2019-12-20 ENCOUNTER — Emergency Department (HOSPITAL_COMMUNITY): Payer: Self-pay

## 2019-12-20 ENCOUNTER — Encounter (HOSPITAL_COMMUNITY): Payer: Self-pay | Admitting: Emergency Medicine

## 2019-12-20 ENCOUNTER — Emergency Department (HOSPITAL_COMMUNITY)
Admission: EM | Admit: 2019-12-20 | Discharge: 2019-12-20 | Disposition: A | Discharge status: Home / Self Care | Payer: Self-pay | Attending: Emergency Medicine | Admitting: Emergency Medicine

## 2019-12-20 ENCOUNTER — Other Ambulatory Visit: Payer: Self-pay

## 2019-12-20 DIAGNOSIS — F17210 Nicotine dependence, cigarettes, uncomplicated: Secondary | ICD-10-CM | POA: Insufficient documentation

## 2019-12-20 DIAGNOSIS — N50811 Right testicular pain: Secondary | ICD-10-CM | POA: Insufficient documentation

## 2019-12-20 NOTE — ED Notes (Signed)
Patient transported to [facility] 

## 2019-12-20 NOTE — ED Triage Notes (Signed)
C/o groin pain.  States he was hit in the groin playing football 2 days ago.

## 2019-12-20 NOTE — ED Provider Notes (Signed)
[facility] EMERGENCY DEPARTMENT Provider Note   CSN: [id number] Arrival date & time: 12/20/19  1117     History Chief Complaint  Patient presents with  . Groin Pain    Dennis Hayden is a 34 y.o. male.  34 y/o male with no PMH presenting to the ER for testicular pain. 2 days ago patient was playing football and the ball hit him in the right testicle and having pain. No dysuria or swelling.        Past Medical History:  Diagnosis Date  . No pertinent past medical history     Patient Active Problem List   Diagnosis Date Noted  . Complete tear of right ACL, initial encounter 02/02/2019  . Bucket handle tear of lateral meniscus of right knee, initial encounter 02/02/2019    Past Surgical History:  Procedure Laterality Date  . ANTERIOR CRUCIATE LIGAMENT REPAIR    . KNEE ARTHROSCOPY WITH ANTERIOR CRUCIATE LIGAMENT (ACL) REPAIR Right 02/02/2019   Procedure: RIGHT KNEE ARTHROSCOPY, LATERAL MENISCUS REPAIR, ANTERIOR CRUCIATE LIGAMENT RECONSTRUCTION ALLOGRAPH;  Surgeon: Tarry Kos, MD;  Location: [facility];  Service: Orthopedics;  Laterality: Right;       Family History  Problem Relation Age of Onset  . Hypertension Mother     Social History   Tobacco Use  . Smoking status: Current Every Day Smoker    Packs/day: 1.00    Types: Cigars, Cigarettes  . Smokeless tobacco: Never Used  Substance Use Topics  . Alcohol use: Yes  . Drug use: Yes    Types: Marijuana    Comment: occas    Home Medications Prior to Admission medications   Medication Sig Start Date End Date Taking? Authorizing Provider  promethazine (PHENERGAN) 25 MG tablet Take 1 tablet (25 mg total) by mouth every 6 (six) hours as needed for nausea. Patient not taking: Reported on 03/30/2019 02/02/19 06/10/19  Tarry Kos, MD    Allergies    Patient has no known allergies.  Review of Systems   Review of Systems  Constitutional: Negative for chills and fever.   Respiratory: Negative for cough and shortness of breath.   Gastrointestinal: Negative for abdominal pain, nausea and vomiting.  Genitourinary: Positive for testicular pain. Negative for dysuria, flank pain, penile pain, penile swelling and scrotal swelling.  Skin: Negative for rash.  Neurological: Negative for numbness.    Physical Exam Updated Vital Signs BP (!) 141/102 (BP Location: Right Arm)   Pulse 80   Temp 98.3 F (36.8 C) (Oral)   Resp 18   Ht 5\' 11"  (1.803 m)   Wt 99.8 kg   SpO2 93%   BMI 30.68 kg/m   Physical Exam Vitals and nursing note reviewed.  Constitutional:      General: He is not in acute distress.    Appearance: Normal appearance. He is not ill-appearing, toxic-appearing or diaphoretic.  HENT:     Head: Normocephalic.  Eyes:     Conjunctiva/sclera: Conjunctivae normal.  Pulmonary:     Effort: Pulmonary effort is normal.  Genitourinary:    Penis: Normal.      Testes: Cremasteric reflex is present.        Right: Tenderness present. Swelling or testicular hydrocele not present.     Epididymis:     Right: Normal.     Left: Normal.  Skin:    General: Skin is dry.  Neurological:     Mental Status: He is alert.  Psychiatric:  Mood and Affect: Mood normal.     ED Results / Procedures / Treatments   Labs (all labs ordered are listed, but only abnormal results are displayed) Labs Reviewed - No data to display  EKG None  Radiology US SCROTUM W/DOPPLER  Result Date: 12/20/2019 CLINICAL DATA:  Right testicular pain after injury 2 days ago. EXAM: SCROTAL ULTRASOUND DOPPLER ULTRASOUND OF THE TESTICLES TECHNIQUE: Complete ultrasound examination of the testicles, epididymis, and other scrotal structures was performed. Color and spectral Doppler ultrasound were also utilized to evaluate blood flow to the testicles. COMPARISON:  None. FINDINGS: Right testicle Measurements: 4.6 x 2.7 x 2.1 cm. Heterogeneous area measuring 1.9 x 1.7 x 1.2 cm is noted in  the right testicle. Given the history of recent trauma, this most likely represents hematoma or other testicular injury. The right testicle appears to be slightly more vascular than the left which may represent posttraumatic inflammation. Left testicle Measurements: 4.1 x 3.0 x 2.1 cm. No mass or microlithiasis visualized. Right epididymis:  Normal in size and appearance. Left epididymis:  Normal in size and appearance. Hydrocele:  Small right hydrocele is noted. Varicocele:  Mild right varicocele is noted. Pulsed Doppler interrogation of both testes demonstrates normal low resistance arterial and venous waveforms bilaterally. IMPRESSION: Focal heterogeneous area is noted in the right testicle which most likely represents hematoma or other testicular injury given the history of recent trauma. Follow-up ultrasound in several weeks is recommended to ensure improvement or resolution. There is no definite evidence of testicular torsion. Small right hydrocele is noted. Mild right varicocele is noted. Electronically Signed   By: Lupita Raider M.D.   On: 12/20/2019 14:04    Procedures Procedures (including critical care time)  Medications Ordered in ED Medications - No data to display  ED Course  I have reviewed the triage vital signs and the nursing notes.  Pertinent labs & imaging results that were available during my care of the patient were reviewed by me and considered in my medical decision making (see chart for details).  Clinical Course as of Dec 20 1415  Wed Dec 20, 2019  1321 Patient presenting with traumatic right scrotal pain for 2 days. Well appearing on exam. Tender right testicle but no obvious signs of trauma.    [KM]  1416 Workup reassuring. Hematoma on ultrasound. Pain controlled. Advised conservative tx and f/u with urology if symptoms persist   [KM]    Clinical Course User Index [KM] Jeral Pinch   MDM Rules/Calculators/A&P                      Based on review of  vitals, medical screening exam, lab work and/or imaging, there does not appear to be an acute, emergent etiology for the patient's symptoms. Counseled pt on good return precautions and encouraged both PCP and ED follow-up as needed.  Prior to discharge, I also discussed incidental imaging findings with patient in detail and advised appropriate, recommended follow-up in detail.  Clinical Impression: 1. Testicular pain, right     Disposition: Discharge  Prior to providing a prescription for a controlled substance, I independently reviewed the patient's recent prescription history on the [place] Controlled Substance Reporting System. The patient had no recent or regular prescriptions and was deemed appropriate for a brief, less than 3 day prescription of narcotic for acute analgesia.  This note was prepared with assistance of Conservation officer, historic buildings. Occasional wrong-word or sound-a-like substitutions may have occurred due  to the inherent limitations of voice recognition software.  Final Clinical Impression(s) / ED Diagnoses Final diagnoses:  Testicular pain, right    Rx / DC Orders ED Discharge Orders    None       Kristine Royal 12/20/19 1417    Fredia Sorrow, MD 12/23/19 [phone]
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
## 2022-09-10 DIAGNOSIS — I10 Essential (primary) hypertension: Secondary | ICD-10-CM

## 2022-09-10 HISTORY — DX: Essential (primary) hypertension: I10

## 2022-10-04 ENCOUNTER — Emergency Department (HOSPITAL_COMMUNITY)
Admission: EM | Admit: 2022-10-04 | Discharge: 2022-10-05 | Disposition: A | Discharge status: Home / Self Care | Payer: Self-pay | Attending: Student | Admitting: Student

## 2022-10-04 ENCOUNTER — Other Ambulatory Visit: Payer: Self-pay

## 2022-10-04 DIAGNOSIS — R030 Elevated blood-pressure reading, without diagnosis of hypertension: Secondary | ICD-10-CM | POA: Insufficient documentation

## 2022-10-04 DIAGNOSIS — Z202 Contact with and (suspected) exposure to infections with a predominantly sexual mode of transmission: Secondary | ICD-10-CM | POA: Insufficient documentation

## 2022-10-04 LAB — BASIC METABOLIC PANEL
Anion gap: 14 (ref 5–15)
BUN: 9 mg/dL (ref 6–20)
CO2: 23 mmol/L (ref 22–32)
Calcium: 9.2 mg/dL (ref 8.9–10.3)
Chloride: 104 mmol/L (ref 98–111)
Creatinine, Ser: 0.99 mg/dL (ref 0.61–1.24)
GFR, Estimated: >60 mL/min (ref >60)
Glucose, Bld: 103 mg/dL — ABNORMAL HIGH (ref 70–99)
Potassium: 3.3 mmol/L — ABNORMAL LOW (ref 3.5–5.1)
Sodium: 141 mmol/L (ref 135–145)

## 2022-10-04 LAB — URINALYSIS, ROUTINE W REFLEX MICROSCOPIC
Bilirubin Urine: NEGATIVE
Glucose, UA: NEGATIVE mg/dL
Ketones, ur: NEGATIVE mg/dL
Leukocytes,Ua: NEGATIVE
Nitrite: NEGATIVE
Protein, ur: >=300 mg/dL — AB
Specific Gravity, Urine: 1.015 (ref 1.005–1.030)
pH: 6 (ref 5.0–8.0)

## 2022-10-04 NOTE — ED Provider Notes (Incomplete)
  [facility] Provider Note   CSN: [id number] Arrival date & time: 10/04/22  1942     History {Add pertinent medical, surgical, social history, OB history to HPI:1} Chief Complaint  Patient presents with   Exposure to STD    Dennis Hayden is a 37 y.o. male.  HPI     Home Medications Prior to Admission medications   Medication Sig Start Date End Date Taking? Authorizing Provider  promethazine (PHENERGAN) 25 MG tablet Take 1 tablet (25 mg total) by mouth every 6 (six) hours as needed for nausea. Patient not taking: Reported on 03/30/2019 02/02/19 06/10/19  Leandrew Koyanagi, MD      Allergies    Patient has no known allergies.    Review of Systems   Review of Systems  Physical Exam Updated Vital Signs BP (!) 197/118   Pulse 94   Temp 98.3 F (36.8 C) (Oral)   Resp 19   SpO2 96%  Physical Exam  ED Results / Procedures / Treatments   Labs (all labs ordered are listed, but only abnormal results are displayed) Labs Reviewed  URINALYSIS, ROUTINE W REFLEX MICROSCOPIC - Abnormal; Notable for the following components:      Result Value   Hgb urine dipstick SMALL (*)    Protein, ur >=300 (*)    Bacteria, UA RARE (*)    All other components within normal limits  HIV ANTIBODY (ROUTINE TESTING W REFLEX)  CBC WITH DIFFERENTIAL/PLATELET  BASIC METABOLIC PANEL  GC/CHLAMYDIA PROBE AMP ([place]) NOT AT [facility]    EKG None  Radiology No results found.  Procedures Procedures  {Document cardiac monitor, telemetry assessment procedure when appropriate:1}  Medications Ordered in ED Medications - No data to display  ED Course/ Medical Decision Making/ A&P   {   Click here for ABCD2, HEART and other calculatorsREFRESH Note before signing :1}                          Medical Decision Making Amount and/or Complexity of Data Reviewed Labs: ordered.  Risk Prescription drug management.   ***  {Document critical care time  when appropriate:1} {Document review of labs and clinical decision tools ie heart score, Chads2Vasc2 etc:1}  {Document your independent review of radiology images, and any outside records:1} {Document your discussion with family members, caretakers, and with consultants:1} {Document social determinants of health affecting pt's care:1} {Document your decision making why or why not admission, treatments were needed:1} Final Clinical Impression(s) / ED Diagnoses Final diagnoses:  None    Rx / DC Orders ED Discharge Orders     None

## 2022-10-04 NOTE — ED Triage Notes (Addendum)
Triage BP taken several times BP 202/131 (151). Pt denies Hx of HTN. Denies headaches/lightheadedness/dizziness.

## 2022-10-04 NOTE — ED Triage Notes (Signed)
Pt states concern for STD exposure. Endorses "new bumps." Requesting STD screening. No discharge. No urinary s/s.

## 2022-10-05 LAB — CBC WITH DIFFERENTIAL/PLATELET
Abs Immature Granulocytes: 0.01 10*3/uL (ref 0.00–0.07)
Basophils Absolute: 0 10*3/uL (ref 0.0–0.1)
Basophils Relative: 1 %
Eosinophils Absolute: 0.1 10*3/uL (ref 0.0–0.5)
Eosinophils Relative: 1 %
HCT: 49.3 % (ref 39.0–52.0)
Hemoglobin: 16.8 g/dL (ref 13.0–17.0)
Immature Granulocytes: 0 %
Lymphocytes Relative: 45 %
Lymphs Abs: 1.9 10*3/uL (ref 0.7–4.0)
MCH: 31.1 pg (ref 26.0–34.0)
MCHC: 34.1 g/dL (ref 30.0–36.0)
MCV: 91.1 fL (ref 80.0–100.0)
Monocytes Absolute: 0.3 10*3/uL (ref 0.1–1.0)
Monocytes Relative: 7 %
Neutro Abs: 2 10*3/uL (ref 1.7–7.7)
Neutrophils Relative %: 46 %
Platelets: 277 10*3/uL (ref 150–400)
RBC: 5.41 MIL/uL (ref 4.22–5.81)
RDW: 14.2 % (ref 11.5–15.5)
WBC: 4.3 10*3/uL (ref 4.0–10.5)
nRBC: 0 % (ref 0.0–0.2)

## 2022-10-05 LAB — GC/CHLAMYDIA PROBE AMP (~~LOC~~) NOT AT ARMC
Chlamydia: POSITIVE — AB
Comment: NEGATIVE
Comment: NORMAL
Neisseria Gonorrhea: NEGATIVE

## 2022-10-05 LAB — HIV ANTIBODY (ROUTINE TESTING W REFLEX): HIV Screen 4th Generation wRfx: NONREACTIVE

## 2022-10-05 MED ORDER — AMLODIPINE BESYLATE 5 MG PO TABS: 10.0000 mg | ORAL_TABLET | Freq: Every day | ORAL | 0 refills | Status: AC

## 2022-10-05 MED ORDER — AMLODIPINE BESYLATE 5 MG PO TABS
10.0000 mg | ORAL_TABLET | Freq: Once | ORAL | Status: AC
  Administered 2022-10-05: 10 mg via ORAL
  Filled 2022-10-05: qty 2

## 2022-10-05 NOTE — Discharge Instructions (Signed)
High blood pressure-if blood pressure was elevated today, I am concerned that you have chronic high blood pressure, started you on blood pressure medication, please start taking this daily, if you start to get  lightheaded or dizziness especially going from sitting to standing you may cut the dose in half and take 1 pill daily instead of 2.  Please start keeping track of your blood pressure, I recommend keeping a log and write down your readings twice daily.  Please follow-up with community health and wellness for further management. STD exposure-your gonorrhea and Chlamydia tests are pending, your results will be posted on your MyChart account in the next 24 to 48 hours, if you test positive please come back to the emergency department for treatment.  You are also follow-up at the health department who evaluate and treat for free.  Come back to the emergency department if you develop chest pain, shortness of breath, severe abdominal pain, uncontrolled nausea, vomiting, diarrhea.

## 2022-10-05 NOTE — ED Provider Notes (Signed)
  [facility] Provider Note   CSN: [id number] Arrival date & time: 10/04/22  1942     History {Add pertinent medical, surgical, social history, OB history to HPI:1} Chief Complaint  Patient presents with   Exposure to STD    Gal Lefton is a 37 y.o. male.  HPI     Home Medications Prior to Admission medications   Medication Sig Start Date End Date Taking? Authorizing Provider  promethazine (PHENERGAN) 25 MG tablet Take 1 tablet (25 mg total) by mouth every 6 (six) hours as needed for nausea. Patient not taking: Reported on 03/30/2019 02/02/19 06/10/19  Leandrew Koyanagi, MD      Allergies    Patient has no known allergies.    Review of Systems   Review of Systems  Physical Exam Updated Vital Signs BP (!) 197/118   Pulse 94   Temp 98.3 F (36.8 C) (Oral)   Resp 19   SpO2 96%  Physical Exam  ED Results / Procedures / Treatments   Labs (all labs ordered are listed, but only abnormal results are displayed) Labs Reviewed  URINALYSIS, ROUTINE W REFLEX MICROSCOPIC - Abnormal; Notable for the following components:      Result Value   Hgb urine dipstick SMALL (*)    Protein, ur >=300 (*)    Bacteria, UA RARE (*)    All other components within normal limits  BASIC METABOLIC PANEL - Abnormal; Notable for the following components:   Potassium 3.3 (*)    Glucose, Bld 103 (*)    All other components within normal limits  HIV ANTIBODY (ROUTINE TESTING W REFLEX)  CBC WITH DIFFERENTIAL/PLATELET  GC/CHLAMYDIA PROBE AMP ([place]) NOT AT [facility]    EKG None  Radiology No results found.  Procedures Procedures  {Document cardiac monitor, telemetry assessment procedure when appropriate:1}  Medications Ordered in ED Medications - No data to display  ED Course/ Medical Decision Making/ A&P   {   Click here for ABCD2, HEART and other calculatorsREFRESH Note before signing :1}                          Medical Decision  Making Amount and/or Complexity of Data Reviewed Labs: ordered.   ***  {Document critical care time when appropriate:1} {Document review of labs and clinical decision tools ie heart score, Chads2Vasc2 etc:1}  {Document your independent review of radiology images, and any outside records:1} {Document your discussion with family members, caretakers, and with consultants:1} {Document social determinants of health affecting pt's care:1} {Document your decision making why or why not admission, treatments were needed:1} Final Clinical Impression(s) / ED Diagnoses Final diagnoses:  None    Rx / DC Orders ED Discharge Orders     None

## 2022-10-07 ENCOUNTER — Telehealth (HOSPITAL_COMMUNITY): Payer: Self-pay

## 2022-10-07 MED ORDER — AZITHROMYCIN 500 MG PO TABS: 1000.0000 mg | ORAL_TABLET | Freq: Once | ORAL | 0 refills | Status: AC

## 2022-12-20 ENCOUNTER — Emergency Department (HOSPITAL_COMMUNITY)
Admission: EM | Admit: 2022-12-20 | Discharge: 2022-12-20 | Disposition: A | Discharge status: Home / Self Care | Payer: 59 | Attending: Emergency Medicine | Admitting: Emergency Medicine

## 2022-12-20 ENCOUNTER — Other Ambulatory Visit: Payer: Self-pay

## 2022-12-20 ENCOUNTER — Encounter (HOSPITAL_COMMUNITY): Payer: Self-pay

## 2022-12-20 DIAGNOSIS — K0889 Other specified disorders of teeth and supporting structures: Secondary | ICD-10-CM | POA: Diagnosis present

## 2022-12-20 DIAGNOSIS — I10 Essential (primary) hypertension: Secondary | ICD-10-CM

## 2022-12-20 DIAGNOSIS — K047 Periapical abscess without sinus: Secondary | ICD-10-CM | POA: Diagnosis not present

## 2022-12-20 DIAGNOSIS — Z79899 Other long term (current) drug therapy: Secondary | ICD-10-CM | POA: Insufficient documentation

## 2022-12-20 MED ORDER — LABETALOL HCL 5 MG/ML IV SOLN
20.0000 mg | Freq: Once | INTRAVENOUS | Status: AC
  Administered 2022-12-20: 20 mg via INTRAVENOUS
  Filled 2022-12-20: qty 4

## 2022-12-20 MED ORDER — OXYCODONE-ACETAMINOPHEN 5-325 MG PO TABS: 2.0000 | ORAL_TABLET | ORAL | 0 refills | Status: AC | PRN

## 2022-12-20 MED ORDER — PENICILLIN V POTASSIUM 500 MG PO TABS: 500.0000 mg | ORAL_TABLET | Freq: Four times a day (QID) | ORAL | 0 refills | Status: AC

## 2022-12-20 MED ORDER — HYDROMORPHONE HCL 1 MG/ML IJ SOLN
0.5000 mg | Freq: Once | INTRAMUSCULAR | Status: AC
  Administered 2022-12-20: 0.5 mg via INTRAVENOUS
  Filled 2022-12-20: qty 1

## 2022-12-20 MED ORDER — ONDANSETRON HCL 4 MG/2ML IJ SOLN
4.0000 mg | Freq: Once | INTRAMUSCULAR | Status: AC
  Administered 2022-12-20: 4 mg via INTRAVENOUS
  Filled 2022-12-20: qty 2

## 2022-12-20 MED ORDER — PENICILLIN V POTASSIUM 500 MG PO TABS
500.0000 mg | ORAL_TABLET | Freq: Once | ORAL | Status: AC
  Administered 2022-12-20: 500 mg via ORAL
  Filled 2022-12-20: qty 1

## 2022-12-20 MED ORDER — HYDROMORPHONE HCL 1 MG/ML IJ SOLN
1.0000 mg | Freq: Once | INTRAMUSCULAR | Status: AC
  Administered 2022-12-20: 1 mg via INTRAVENOUS
  Filled 2022-12-20: qty 1

## 2022-12-20 NOTE — ED Triage Notes (Addendum)
Pt c/o dental pain to R lower mouth and swelling that started yesterday. Denies fevers/chills. States that the pain is giving him a headache. Took ibuprofen around noon today.  Noted to by hypertensive in triage. Dx with HTN in February and has been taking amlodipine as prescribed. Took dose today.

## 2022-12-20 NOTE — ED Provider Notes (Signed)
[place] EMERGENCY DEPARTMENT AT [facility] Provider Note   CSN: [id number] Arrival date & time: 12/20/22  1908     History {Add pertinent medical, surgical, social history, OB history to HPI:1} Chief Complaint  Patient presents with   Dental Pain   Facial Swelling    Dennis Hayden is a 37 y.o. male.  Patient has a history of hypertension.  He complains of painful right tooth and swelling to his mouth   Dental Pain      Home Medications Prior to Admission medications   Medication Sig Start Date End Date Taking? Authorizing Provider  amLODipine (NORVASC) 5 MG tablet Take 2 tablets (10 mg total) by mouth daily. 10/05/22 12/20/22 Yes Carroll Sage, PA-C  oxyCODONE-acetaminophen (PERCOCET) 5-325 MG tablet Take 2 tablets by mouth every 4 (four) hours as needed. 12/20/22  Yes Bethann Berkshire, MD  penicillin v potassium (VEETID) 500 MG tablet Take 1 tablet (500 mg total) by mouth 4 (four) times daily for 7 days. 12/20/22 12/27/22 Yes Bethann Berkshire, MD  promethazine (PHENERGAN) 25 MG tablet Take 1 tablet (25 mg total) by mouth every 6 (six) hours as needed for nausea. Patient not taking: Reported on 03/30/2019 02/02/19 06/10/19  Tarry Kos, MD      Allergies    Patient has no known allergies.    Review of Systems   Review of Systems  Physical Exam Updated Vital Signs BP (!) 158/114   Pulse 85   Temp 98.6 F (37 C) (Oral)   Resp 18   Ht 5\' 11"  (1.803 m)   Wt 99.8 kg   SpO2 93%   BMI 30.68 kg/m  Physical Exam  ED Results / Procedures / Treatments   Labs (all labs ordered are listed, but only abnormal results are displayed) Labs Reviewed - No data to display  EKG None  Radiology No results found.  Procedures Procedures  {Document cardiac monitor, telemetry assessment procedure when appropriate:1}  Medications Ordered in ED Medications  HYDROmorphone (DILAUDID) injection 0.5 mg (0.5 mg Intravenous Given 12/20/22 2048)  ondansetron (ZOFRAN)  injection 4 mg (4 mg Intravenous Given 12/20/22 2048)  labetalol (NORMODYNE) injection 20 mg (20 mg Intravenous Given 12/20/22 2047)  penicillin v potassium (VEETID) tablet 500 mg (500 mg Oral Given 12/20/22 2050)  HYDROmorphone (DILAUDID) injection 1 mg (1 mg Intravenous Given 12/20/22 2131)    ED Course/ Medical Decision Making/ A&P   {   Click here for ABCD2, HEART and other calculatorsREFRESH Note before signing :1}                          Medical Decision Making Risk Prescription drug management.   Patient with dental abscess and poorly controlled blood pressure.  He will increase his Norvasc to 10 mg a day.  He is given penicillin and Percocet for his his tooth abscess and will follow-up with the dentist  {Document critical care time when appropriate:1} {Document review of labs and clinical decision tools ie heart score, Chads2Vasc2 etc:1}  {Document your independent review of radiology images, and any outside records:1} {Document your discussion with family members, caretakers, and with consultants:1} {Document social determinants of health affecting pt's care:1} {Document your decision making why or why not admission, treatments were needed:1} Final Clinical Impression(s) / ED Diagnoses Final diagnoses:  Dental abscess  Primary hypertension    Rx / DC Orders ED Discharge Orders          Ordered  oxyCODONE-acetaminophen (PERCOCET) 5-325 MG tablet  Every 4 hours PRN        12/20/22 2217    penicillin v potassium (VEETID) 500 MG tablet  4 times daily        12/20/22 2217

## 2022-12-20 NOTE — Discharge Instructions (Signed)
Follow-up with your family doctor in the next week for your blood pressure.  Make sure you are taking 10 mg of the Norvasc daily.  Follow-up with a dentist in the next couple weeks
